# Patient Record
Sex: Female | Born: 1990 | Hispanic: Yes | Marital: Single | Smoking: Never smoker
Health system: Southern US, Community
[De-identification: ages and names within clinical notes are randomized; demographics above are authoritative.]

## PROBLEM LIST (undated history)

## (undated) ENCOUNTER — Inpatient Hospital Stay (HOSPITAL_COMMUNITY): Payer: Self-pay

## (undated) DIAGNOSIS — Z789 Other specified health status: Secondary | ICD-10-CM

## (undated) DIAGNOSIS — E0590 Thyrotoxicosis, unspecified without thyrotoxic crisis or storm: Secondary | ICD-10-CM

## (undated) DIAGNOSIS — R87629 Unspecified abnormal cytological findings in specimens from vagina: Secondary | ICD-10-CM

## (undated) DIAGNOSIS — N390 Urinary tract infection, site not specified: Secondary | ICD-10-CM

## (undated) HISTORY — PX: LEEP: SHX91

## (undated) HISTORY — PX: NO PAST SURGERIES: SHX2092

## (undated) HISTORY — PX: DILATION AND CURETTAGE OF UTERUS: SHX78

## (undated) HISTORY — DX: Unspecified abnormal cytological findings in specimens from vagina: R87.629

## (undated) HISTORY — DX: Thyrotoxicosis, unspecified without thyrotoxic crisis or storm: E05.90

---

## 2012-11-06 ENCOUNTER — Emergency Department (HOSPITAL_COMMUNITY)
Admission: EM | Admit: 2012-11-06 | Discharge: 2012-11-06 | Disposition: A | Discharge status: Home / Self Care | Payer: Self-pay | Attending: Emergency Medicine | Admitting: Emergency Medicine

## 2012-11-06 ENCOUNTER — Encounter (HOSPITAL_COMMUNITY): Payer: Self-pay

## 2012-11-06 ENCOUNTER — Emergency Department (HOSPITAL_COMMUNITY): Payer: Self-pay

## 2012-11-06 ENCOUNTER — Encounter: Payer: Self-pay | Admitting: Gynecology

## 2012-11-06 DIAGNOSIS — N390 Urinary tract infection, site not specified: Secondary | ICD-10-CM | POA: Insufficient documentation

## 2012-11-06 DIAGNOSIS — R1084 Generalized abdominal pain: Secondary | ICD-10-CM | POA: Insufficient documentation

## 2012-11-06 DIAGNOSIS — K5900 Constipation, unspecified: Secondary | ICD-10-CM | POA: Insufficient documentation

## 2012-11-06 DIAGNOSIS — R109 Unspecified abdominal pain: Secondary | ICD-10-CM | POA: Insufficient documentation

## 2012-11-06 DIAGNOSIS — R110 Nausea: Secondary | ICD-10-CM | POA: Insufficient documentation

## 2012-11-06 DIAGNOSIS — Z880 Allergy status to penicillin: Secondary | ICD-10-CM | POA: Insufficient documentation

## 2012-11-06 DIAGNOSIS — Z3202 Encounter for pregnancy test, result negative: Secondary | ICD-10-CM | POA: Insufficient documentation

## 2012-11-06 DIAGNOSIS — R300 Dysuria: Secondary | ICD-10-CM | POA: Insufficient documentation

## 2012-11-06 LAB — CBC WITH DIFFERENTIAL/PLATELET
Basophils Relative: 1 % (ref 0–1)
Eosinophils Absolute: 0.2 10*3/uL (ref 0.0–0.7)
Eosinophils Relative: 4 % (ref 0–5)
HCT: 41.7 % (ref 36.0–46.0)
Hemoglobin: 14.6 g/dL (ref 12.0–15.0)
MCH: 32.7 pg (ref 26.0–34.0)
MCHC: 35 g/dL (ref 30.0–36.0)
Monocytes Absolute: 0.4 10*3/uL (ref 0.1–1.0)
Monocytes Relative: 5 % (ref 3–12)
RDW: 13.7 % (ref 11.5–15.5)

## 2012-11-06 LAB — URINALYSIS, ROUTINE W REFLEX MICROSCOPIC
Glucose, UA: NEGATIVE mg/dL
Protein, ur: NEGATIVE mg/dL
Specific Gravity, Urine: 1.027 (ref 1.005–1.030)
pH: 6 (ref 5.0–8.0)

## 2012-11-06 LAB — COMPREHENSIVE METABOLIC PANEL
Albumin: 4.5 g/dL (ref 3.5–5.2)
BUN: 12 mg/dL (ref 6–23)
Calcium: 9.8 mg/dL (ref 8.4–10.5)
Creatinine, Ser: 0.58 mg/dL (ref 0.50–1.10)
Total Bilirubin: 1 mg/dL (ref 0.3–1.2)
Total Protein: 8.3 g/dL (ref 6.0–8.3)

## 2012-11-06 LAB — URINE MICROSCOPIC-ADD ON

## 2012-11-06 LAB — POCT PREGNANCY, URINE: Preg Test, Ur: NEGATIVE

## 2012-11-06 MED ORDER — KETOROLAC TROMETHAMINE 60 MG/2ML IM SOLN
60.0000 mg | Freq: Once | INTRAMUSCULAR | Status: AC
  Administered 2012-11-06: 60 mg via INTRAMUSCULAR
  Filled 2012-11-06: qty 2

## 2012-11-06 MED ORDER — NITROFURANTOIN MONOHYD MACRO 100 MG PO CAPS: 100.0000 mg | ORAL_CAPSULE | Freq: Two times a day (BID) | ORAL | Status: DC

## 2012-11-06 MED ORDER — ONDANSETRON 4 MG PO TBDP
8.0000 mg | ORAL_TABLET | Freq: Once | ORAL | Status: AC
  Administered 2012-11-06: 8 mg via ORAL
  Filled 2012-11-06: qty 2

## 2012-11-06 MED ORDER — SULFAMETHOXAZOLE-TRIMETHOPRIM 800-160 MG PO TABS: 1.0000 | ORAL_TABLET | Freq: Two times a day (BID) | ORAL | Status: DC

## 2012-11-06 NOTE — ED Provider Notes (Signed)
History     CSN: [id number]  Arrival date & time 11/06/12  1100   First MD Initiated Contact with Patient 11/06/12 1118      Chief Complaint  Patient presents with  . Abdominal Pain     HPI Pt was seen at 1140.   Per pt, c/o gradual onset and persistence of constant generalized abd "pain" for the past 2 weeks.  Has been associated with nausea, bilat flank pain, constipation and dysuria.  Describes the abd pain as "cramping." Describes the dysuria as "pressure" in her bladder when she urinates.  States her friend gave her "some left over antibiotic" that "made everything better" for a short while, but then symptoms returned.  Denies vomiting/diarrhea, no fevers, no back pain, no rash, no CP/SOB, no black or blood in stools, no vaginal bleeding/discharge.       History reviewed. No pertinent past medical history.  History reviewed. No pertinent past surgical history.    History  Substance Use Topics  . Smoking status: Never Smoker   . Smokeless tobacco: Not on file  . Alcohol Use: Yes     Comment: occassional     Review of Systems ROS: Statement: All systems negative except as marked or noted in the HPI; Constitutional: Negative for fever and chills. ; ; Eyes: Negative for eye pain, redness and discharge. ; ; ENMT: Negative for ear pain, hoarseness, nasal congestion, sinus pressure and sore throat. ; ; Cardiovascular: Negative for chest pain, palpitations, diaphoresis, dyspnea and peripheral edema. ; ; Respiratory: Negative for cough, wheezing and stridor. ; ; Gastrointestinal: +nausea, abd pain. Negative for vomiting, diarrhea, blood in stool, hematemesis, jaundice and rectal bleeding. . ; ; Genitourinary: +dysuria, flank pain.  Negative for hematuria. ; ;GYN:  No vaginal bleeding, no vaginal discharge, no vulvar pain.;; Musculoskeletal: Negative for back pain and neck pain. Negative for swelling and trauma.; ; Skin: Negative for pruritus, rash, abrasions, blisters, bruising and  skin lesion.; ; Neuro: Negative for headache, lightheadedness and neck stiffness. Negative for weakness, altered level of consciousness , altered mental status, extremity weakness, paresthesias, involuntary movement, seizure and syncope.       Allergies  Penicillins  Home Medications  No current outpatient prescriptions on file.  BP 114/91  Pulse 94  Temp(Src) 98.3 F (36.8 C) (Oral)  Resp 18  SpO2 99%  Physical Exam 1145: Physical examination:  Nursing notes reviewed; Vital signs and O2 SAT reviewed;  Constitutional: Well developed, Well nourished, Well hydrated, In no acute distress; Head:  Normocephalic, atraumatic; Eyes: EOMI, PERRL, No scleral icterus; ENMT: Mouth and pharynx normal, Mucous membranes moist; Neck: Supple, Full range of motion, No lymphadenopathy; Cardiovascular: Regular rate and rhythm, No murmur, rub, or gallop; Respiratory: Breath sounds clear & equal bilaterally, No rales, rhonchi, wheezes.  Speaking full sentences with ease, Normal respiratory effort/excursion; Chest: Nontender, Movement normal; Abdomen: Soft, +mild diffuse tenderness to palp. No rebound or guarding. Nondistended, Normal bowel sounds; Genitourinary: No CVA tenderness; Spine:  No midline CS, TS, LS tenderness. +TTP bilat lumbar paraspinal muscles.;;  Extremities: Pulses normal, No tenderness, No edema, No calf edema or asymmetry.; Neuro: AA&Ox3, Major CN grossly intact.  Speech clear. No gross focal motor or sensory deficits in extremities.; Skin: Color normal, Warm, Dry.   ED Course  Procedures    MDM  MDM Reviewed: nursing note and vitals Interpretation: labs and x-ray     Results for orders placed during the hospital encounter of 11/06/12  CBC WITH DIFFERENTIAL  Result Value Range   WBC 6.9  4.0 - 10.5 K/uL   RBC 4.47  3.87 - 5.11 MIL/uL   Hemoglobin 14.6  12.0 - 15.0 g/dL   HCT 78.2  95.6 - 21.3 %   MCV 93.3  78.0 - 100.0 fL   MCH 32.7  26.0 - 34.0 pg   MCHC 35.0  30.0 - 36.0  g/dL   RDW 08.6  57.8 - 46.9 %   Platelets 278  150 - 400 K/uL   Neutrophils Relative 62  43 - 77 %   Neutro Abs 4.3  1.7 - 7.7 K/uL   Lymphocytes Relative 29  12 - 46 %   Lymphs Abs 2.0  0.7 - 4.0 K/uL   Monocytes Relative 5  3 - 12 %   Monocytes Absolute 0.4  0.1 - 1.0 K/uL   Eosinophils Relative 4  0 - 5 %   Eosinophils Absolute 0.2  0.0 - 0.7 K/uL   Basophils Relative 1  0 - 1 %   Basophils Absolute 0.0  0.0 - 0.1 K/uL  COMPREHENSIVE METABOLIC PANEL      Result Value Range   Sodium 142  135 - 145 mEq/L   Potassium 4.1  3.5 - 5.1 mEq/L   Chloride 103  96 - 112 mEq/L   CO2 28  19 - 32 mEq/L   Glucose, Bld 93  70 - 99 mg/dL   BUN 12  6 - 23 mg/dL   Creatinine, Ser 6.29  0.50 - 1.10 mg/dL   Calcium 9.8  8.4 - 52.8 mg/dL   Total Protein 8.3  6.0 - 8.3 g/dL   Albumin 4.5  3.5 - 5.2 g/dL   AST 19  0 - 37 U/L   ALT 11  0 - 35 U/L   Alkaline Phosphatase 53  39 - 117 U/L   Total Bilirubin 1.0  0.3 - 1.2 mg/dL   GFR calc non Af Amer >90  >90 mL/min   GFR calc Af Amer >90  >90 mL/min  URINALYSIS, ROUTINE W REFLEX MICROSCOPIC      Result Value Range   Color, Urine YELLOW  YELLOW   APPearance HAZY (*) CLEAR   Specific Gravity, Urine 1.027  1.005 - 1.030   pH 6.0  5.0 - 8.0   Glucose, UA NEGATIVE  NEGATIVE mg/dL   Hgb urine dipstick SMALL (*) NEGATIVE   Bilirubin Urine NEGATIVE  NEGATIVE   Ketones, ur 15 (*) NEGATIVE mg/dL   Protein, ur NEGATIVE  NEGATIVE mg/dL   Urobilinogen, UA 0.2  0.0 - 1.0 mg/dL   Nitrite NEGATIVE  NEGATIVE   Leukocytes, UA SMALL (*) NEGATIVE  URINE MICROSCOPIC-ADD ON      Result Value Range   Squamous Epithelial / LPF RARE  RARE   WBC, UA 7-10  <3 WBC/hpf   RBC / HPF 0-2  <3 RBC/hpf   Bacteria, UA RARE  RARE  POCT PREGNANCY, URINE      Result Value Range   Preg Test, Ur NEGATIVE  NEGATIVE   Dg Abd Acute W/chest 11/06/2012  *RADIOLOGY REPORT*  Clinical Data: Abdominal pain, constipation  ACUTE ABDOMEN SERIES (ABDOMEN 2 VIEW & CHEST 1 VIEW)   Comparison: None.  Findings: The lungs are clear and well aerated.  Cardiac and mediastinal contours within normal limits.  No pleural effusion or pneumothorax.  No free air on the upright view.  The bowel gas pattern is not obstructed and unremarkable.  Gas is seen throughout the colon  to the rectum.  An IUD projects over the anatomic pelvis.  The colonic stool burden is unremarkable.  No acute osseous abnormality.  IMPRESSION:  1.  No acute cardiopulmonary disease. 2.  Nonobstructed, unremarkable bowel gas pattern. 3.  Colonic stool burden is unremarkable. 4.  IUD projects over the anatomic pelvis.   Original Report Authenticated By: Malachy Moan, M.D.     1430:  Pt eating fast food meal/drink while in the ED without N/V. VSS, abd soft/NT, appears NAD. States she wants to go home now.  Will tx for cystitis. Dx and testing d/w pt and family.  Questions answered.  Verb understanding, agreeable to d/c home with outpt f/u.  1510:  Pt states she "can't afford" the macrobid.  Is allergic to PCN so cannot rx keflex.  Requesting "something off the $4 [facility] list."  D/W pt regarding local UTI bacterial resistance patterns.  Verb understanding, but states she "needs bactrim because it's on the $4 list."  Will rx.       Laray Anger, DO 11/09/12 [phone]

## 2012-11-06 NOTE — ED Notes (Signed)
Symptoms began 2 weeks ago. Bilateral flank pain, and abdominal pain.  Pt. Reports having dysuria and hematuria.  Also having vaginal spotting. Pt. Is nauseated.  Also reports bruising over her extremities and weakness

## 2012-11-08 ENCOUNTER — Ambulatory Visit: Payer: Self-pay | Admitting: Women's Health

## 2012-12-22 DIAGNOSIS — F329 Major depressive disorder, single episode, unspecified: Secondary | ICD-10-CM | POA: Insufficient documentation

## 2016-09-23 ENCOUNTER — Ambulatory Visit: Payer: Self-pay | Admitting: Gynecology

## 2016-09-23 DIAGNOSIS — Z0289 Encounter for other administrative examinations: Secondary | ICD-10-CM

## 2016-10-11 ENCOUNTER — Ambulatory Visit: Payer: Self-pay | Admitting: Gynecology

## 2016-10-11 DIAGNOSIS — Z0289 Encounter for other administrative examinations: Secondary | ICD-10-CM

## 2016-11-30 ENCOUNTER — Encounter: Payer: Self-pay | Admitting: Gynecology

## 2017-09-12 ENCOUNTER — Encounter (HOSPITAL_COMMUNITY): Payer: Self-pay | Admitting: *Deleted

## 2017-09-12 ENCOUNTER — Inpatient Hospital Stay (HOSPITAL_COMMUNITY)
Admission: AD | Admit: 2017-09-12 | Discharge: 2017-09-12 | Disposition: A | Discharge status: Home / Self Care | Payer: Medicaid Other | Source: Ambulatory Visit | Attending: Family Medicine | Admitting: Family Medicine

## 2017-09-12 ENCOUNTER — Other Ambulatory Visit: Payer: Self-pay

## 2017-09-12 ENCOUNTER — Inpatient Hospital Stay (HOSPITAL_COMMUNITY): Payer: Medicaid Other

## 2017-09-12 DIAGNOSIS — O26891 Other specified pregnancy related conditions, first trimester: Secondary | ICD-10-CM | POA: Insufficient documentation

## 2017-09-12 DIAGNOSIS — O26899 Other specified pregnancy related conditions, unspecified trimester: Secondary | ICD-10-CM

## 2017-09-12 DIAGNOSIS — R109 Unspecified abdominal pain: Secondary | ICD-10-CM | POA: Diagnosis present

## 2017-09-12 DIAGNOSIS — O23592 Infection of other part of genital tract in pregnancy, second trimester: Secondary | ICD-10-CM | POA: Diagnosis not present

## 2017-09-12 DIAGNOSIS — B9689 Other specified bacterial agents as the cause of diseases classified elsewhere: Secondary | ICD-10-CM | POA: Insufficient documentation

## 2017-09-12 DIAGNOSIS — N760 Acute vaginitis: Secondary | ICD-10-CM

## 2017-09-12 DIAGNOSIS — Z3A01 Less than 8 weeks gestation of pregnancy: Secondary | ICD-10-CM | POA: Insufficient documentation

## 2017-09-12 DIAGNOSIS — Z3491 Encounter for supervision of normal pregnancy, unspecified, first trimester: Secondary | ICD-10-CM

## 2017-09-12 LAB — WET PREP, GENITAL
SPERM: NONE SEEN
Trich, Wet Prep: NONE SEEN
YEAST WET PREP: NONE SEEN

## 2017-09-12 LAB — URINALYSIS, ROUTINE W REFLEX MICROSCOPIC
Bilirubin Urine: NEGATIVE
Glucose, UA: NEGATIVE mg/dL
Hgb urine dipstick: NEGATIVE
Ketones, ur: NEGATIVE mg/dL
LEUKOCYTES UA: NEGATIVE
NITRITE: NEGATIVE
PH: 6 (ref 5.0–8.0)
Protein, ur: NEGATIVE mg/dL
SPECIFIC GRAVITY, URINE: 1.008 (ref 1.005–1.030)

## 2017-09-12 LAB — POCT PREGNANCY, URINE: Preg Test, Ur: POSITIVE — AB

## 2017-09-12 LAB — HCG, QUANTITATIVE, PREGNANCY: hCG, Beta Chain, Quant, S: 33138 m[IU]/mL — ABNORMAL HIGH (ref <5)

## 2017-09-12 LAB — CBC
HEMATOCRIT: 37.9 % (ref 36.0–46.0)
Hemoglobin: 12.7 g/dL (ref 12.0–15.0)
MCH: 32.6 pg (ref 26.0–34.0)
MCHC: 33.5 g/dL (ref 30.0–36.0)
MCV: 97.2 fL (ref 78.0–100.0)
PLATELETS: 338 10*3/uL (ref 150–400)
RBC: 3.9 MIL/uL (ref 3.87–5.11)
RDW: 12.9 % (ref 11.5–15.5)
WBC: 7.5 10*3/uL (ref 4.0–10.5)

## 2017-09-12 LAB — ABO/RH: ABO/RH(D): A POS

## 2017-09-12 MED ORDER — METRONIDAZOLE 500 MG PO TABS: 500.0000 mg | ORAL_TABLET | Freq: Two times a day (BID) | ORAL | 0 refills | Status: DC

## 2017-09-12 NOTE — MAU Provider Note (Signed)
History     CSN: [id number]  Arrival date and time: 09/12/17 1141   First Provider Initiated Contact with Patient 09/12/17 1252      Chief Complaint  Patient presents with  . Abdominal Pain  . Vaginal Bleeding  . Possible Pregnancy   HPI Martha Johnson is a 27 y.o. G2P1001 at 769w5d by LMP who presents with abdominal pain and vaginal bleeding. Had positive HPT but hasn't been seen by ob/gyn yet. Reports LLQ pain since last night. Pain is intermittent. Rates 7/10. Has not treated pain. Nothing makes better or worse. Has noticed pink spotting on toilet paper since yesterday. Is not bleeding into a pad or passing clots. Last intercourse 3 days ago. Denies fever/chills, dysuria, vaginal discharge, or diarrhea.   OB History    Gravida Para Term Preterm AB Living   2 1 1     1    SAB TAB Ectopic Multiple Live Births           1      History reviewed. No pertinent past medical history.  Past Surgical History:  Procedure Laterality Date  . LEEP      No family history on file.  Social History   Tobacco Use  . Smoking status: Never Smoker  Substance Use Topics  . Alcohol use: Yes    Comment: occassional  . Drug use: Yes    Types: Marijuana    Allergies:  Allergies  Allergen Reactions  . Penicillins Other (See Comments)    Childhood reaction    Medications Prior to Admission  Medication Sig Dispense Refill Last Dose  . sulfamethoxazole-trimethoprim (BACTRIM DS) 800-160 MG per tablet Take 1 tablet by mouth 2 (two) times daily. 14 tablet 0     Review of Systems  Constitutional: Negative.   Gastrointestinal: Positive for abdominal pain. Negative for diarrhea, nausea and vomiting.  Genitourinary: Positive for vaginal bleeding. Negative for dysuria and vaginal discharge.   Physical Exam   Blood pressure 118/62, pulse 88, temperature 98.3 F (36.8 C), temperature source Oral, resp. rate 17, height 5' 9.5" (1.765 m), weight 188 lb (85.3 kg), last menstrual period  07/06/2017, SpO2 100 %.  Physical Exam  Nursing note and vitals reviewed. Constitutional: She is oriented to person, place, and time. She appears well-developed and well-nourished. No distress.  HENT:  Head: Normocephalic and atraumatic.  Eyes: Conjunctivae are normal. Right eye exhibits no discharge. Left eye exhibits no discharge. No scleral icterus.  Neck: Normal range of motion.  Respiratory: Effort normal. No respiratory distress.  GI: Soft. She exhibits no distension. There is no tenderness.  Genitourinary: Cervix exhibits no motion tenderness and no friability. No bleeding in the vagina. Vaginal discharge (small amount of gray thin discharge) found.  Genitourinary Comments: Cervix closed  Neurological: She is alert and oriented to person, place, and time.  Skin: Skin is warm and dry. She is not diaphoretic.  Psychiatric: She has a normal mood and affect. Her behavior is normal. Judgment and thought content normal.    MAU Course  Procedures Results for orders placed or performed during the hospital encounter of 09/12/17 (from the past 24 hour(s))  Urinalysis, Routine w reflex microscopic ([facility])     Status: Abnormal   Collection Time: 09/12/17 12:00 PM  Result Value Ref Range   Color, Urine STRAW (A) YELLOW   APPearance CLEAR CLEAR   Specific Gravity, Urine 1.008 1.005 - 1.030   pH 6.0 5.0 - 8.0   Glucose, UA NEGATIVE NEGATIVE  mg/dL   Hgb urine dipstick NEGATIVE NEGATIVE   Bilirubin Urine NEGATIVE NEGATIVE   Ketones, ur NEGATIVE NEGATIVE mg/dL   Protein, ur NEGATIVE NEGATIVE mg/dL   Nitrite NEGATIVE NEGATIVE   Leukocytes, UA NEGATIVE NEGATIVE  Pregnancy, urine POC     Status: Abnormal   Collection Time: 09/12/17 12:26 PM  Result Value Ref Range   Preg Test, Ur POSITIVE (A) NEGATIVE  CBC     Status: None   Collection Time: 09/12/17 12:56 PM  Result Value Ref Range   WBC 7.5 4.0 - 10.5 K/uL   RBC 3.90 3.87 - 5.11 MIL/uL   Hemoglobin 12.7 12.0 - 15.0 g/dL   HCT  16.1 09.6 - 04.5 %   MCV 97.2 78.0 - 100.0 fL   MCH 32.6 26.0 - 34.0 pg   MCHC 33.5 30.0 - 36.0 g/dL   RDW 40.9 81.1 - 91.4 %   Platelets 338 150 - 400 K/uL  hCG, quantitative, pregnancy     Status: Abnormal   Collection Time: 09/12/17 12:56 PM  Result Value Ref Range   hCG, Beta Chain, Quant, S 33,138 (H) <5 mIU/mL  ABO/Rh     Status: None   Collection Time: 09/12/17 12:57 PM  Result Value Ref Range   ABO/RH(D)      A POS Performed at [facility], [address], [place], [place] [address]   Wet prep, genital     Status: Abnormal   Collection Time: 09/12/17  3:05 PM  Result Value Ref Range   Yeast Wet Prep HPF POC NONE SEEN NONE SEEN   Trich, Wet Prep NONE SEEN NONE SEEN   Clue Cells Wet Prep HPF POC PRESENT (A) NONE SEEN   WBC, Wet Prep HPF POC FEW (A) NONE SEEN   Sperm NONE SEEN    US Ob Less Than 14 Weeks With Ob Transvaginal  Result Date: 09/12/2017 CLINICAL DATA:  Abdominal pain, first trimester pregnancy, LEFT side pain for and spotting for 1 day, quantitative beta HCG = 33,138 EXAM: OBSTETRIC <14 WK [place] AND TRANSVAGINAL OB US TECHNIQUE: Both transabdominal and transvaginal ultrasound examinations were performed for complete evaluation of the gestation as well as the maternal uterus, adnexal regions, and pelvic cul-de-sac. Transvaginal technique was performed to assess early pregnancy. COMPARISON:  None FINDINGS: Intrauterine gestational sac: Present, single Yolk sac:  Present Embryo:  Present Cardiac Activity: Not visualize Heart Rate: N/A  bpm CRL:  4.9 mm   6 w   1 d                  [place] EDC: 05/07/2018 Subchorionic hemorrhage:  None visualized. Maternal uterus/adnexae: RIGHT ovary normal size and morphology, 2.7 x 2.0 x 1.6 cm. LEFT ovary measures 2.7 x 3.6 x 2.4 cm and contains a small corpus luteal cyst. No adnexal masses or free pelvic fluid. IMPRESSION: Single intrauterine gestation with crown-rump length corresponding to 6 weeks 1 day EGA. However no fetal cardiac  activity is identified. Findings are suspicious but not yet definitive for failed pregnancy. Recommend follow-up US in 10-14 days for definitive diagnosis. This recommendation follows SRU consensus guidelines: Diagnostic Criteria for Nonviable Pregnancy Early in the First Trimester. Malva Limes Med 2013; 621:3086-57. Electronically Signed   By: Ulyses Southward M.D.   On: 09/12/2017 14:47    MDM +UPT UA, wet prep, GC/chlamydia, CBC, ABO/Rh, quant hCG, HIV, and [place] today to rule out ectopic pregnancy A positive On exam, no bleeding noted and cervix closed/thick Ultrasound shows SIUP  measuring [redacted]w[redacted]d (EDD updated) --- no cardiac activity, CRL 4.47mm. CLC in left ovary.  Discussed results with patient. FHR may be too early to be seen or could be failed pregnancy. Will schedule outpatient ultrasound for viability.   Assessment and Plan  A; 1. BV (bacterial vaginosis)   2. Abdominal pain affecting pregnancy   3. Normal IUP (intrauterine pregnancy) on prenatal ultrasound, first trimester    P: Discharge home Rx flagyl Outpatient ultrasound in 10 days for viability Miscarriage precautions  Judeth Horn 09/12/2017, 12:52 PM

## 2017-09-12 NOTE — Discharge Instructions (Signed)
Bacterial Vaginosis Bacterial vaginosis is a vaginal infection that occurs when the normal balance of bacteria in the vagina is disrupted. It results from an overgrowth of certain bacteria. This is the most common vaginal infection among women ages 7-44. Because bacterial vaginosis increases your risk for STIs (sexually transmitted infections), getting treated can help reduce your risk for chlamydia, gonorrhea, herpes, and HIV (human immunodeficiency virus). Treatment is also important for preventing complications in pregnant women, because this condition can cause an early (premature) delivery. What are the causes? This condition is caused by an increase in harmful bacteria that are normally present in small amounts in the vagina. However, the reason that the condition develops is not fully understood. What increases the risk? The following factors may make you more likely to develop this condition:  Having a new sexual partner or multiple sexual partners.  Having unprotected sex.  Douching.  Having an intrauterine device (IUD).  Smoking.  Drug and alcohol abuse.  Taking certain antibiotic medicines.  Being pregnant.  You cannot get bacterial vaginosis from toilet seats, bedding, swimming pools, or contact with objects around you. What are the signs or symptoms? Symptoms of this condition include:  Grey or white vaginal discharge. The discharge can also be watery or foamy.  A fish-like odor with discharge, especially after sexual intercourse or during menstruation.  Itching in and around the vagina.  Burning or pain with urination.  Some women with bacterial vaginosis have no signs or symptoms. How is this diagnosed? This condition is diagnosed based on:  Your medical history.  A physical exam of the vagina.  Testing a sample of vaginal fluid under a microscope to look for a large amount of bad bacteria or abnormal cells. Your health care provider may use a cotton swab  or a small wooden spatula to collect the sample.  How is this treated? This condition is treated with antibiotics. These may be given as a pill, a vaginal cream, or a medicine that is put into the vagina (suppository). If the condition comes back after treatment, a second round of antibiotics may be needed. Follow these instructions at home: Medicines  Take over-the-counter and prescription medicines only as told by your health care provider.  Take or use your antibiotic as told by your health care provider. Do not stop taking or using the antibiotic even if you start to feel better. General instructions  If you have a female sexual partner, tell her that you have a vaginal infection. She should see her health care provider and be treated if she has symptoms. If you have a female sexual partner, he does not need treatment.  During treatment: ? Avoid sexual activity until you finish treatment. ? Do not douche. ? Avoid alcohol as directed by your health care provider. ? Avoid breastfeeding as directed by your health care provider.  Drink enough water and fluids to keep your urine clear or pale yellow.  Keep the area around your vagina and rectum clean. ? Wash the area daily with warm water. ? Wipe yourself from front to back after using the toilet.  Keep all follow-up visits as told by your health care provider. This is important. How is this prevented?  Do not douche.  Wash the outside of your vagina with warm water only.  Use protection when having sex. This includes latex condoms and dental dams.  Limit how many sexual partners you have. To help prevent bacterial vaginosis, it is best to have sex with just  one partner (monogamous).  Make sure you and your sexual partner are tested for STIs.  Wear cotton or cotton-lined underwear.  Avoid wearing tight pants and pantyhose, especially during summer.  Limit the amount of alcohol that you drink.  Do not use any products that  contain nicotine or tobacco, such as cigarettes and e-cigarettes. If you need help quitting, ask your health care provider.  Do not use illegal drugs. Where to find more information:  Centers for Disease Control and Prevention: SolutionApps.co.zawww.cdc.gov/std  American Sexual Health Association (ASHA): www.ashastd.org  U.S. Department of Health and Health and safety inspectorHuman Services, Office on Women's Health: ConventionalMedicines.siwww.womenshealth.gov/ or http://www.anderson-williamson.info/https://www.womenshealth.gov/a-z-topics/bacterial-vaginosis Contact a health care provider if:  Your symptoms do not improve, even after treatment.  You have more discharge or pain when urinating.  You have a fever.  You have pain in your abdomen.  You have pain during sex.  You have vaginal bleeding between periods. Summary  Bacterial vaginosis is a vaginal infection that occurs when the normal balance of bacteria in the vagina is disrupted.  Because bacterial vaginosis increases your risk for STIs (sexually transmitted infections), getting treated can help reduce your risk for chlamydia, gonorrhea, herpes, and HIV (human immunodeficiency virus). Treatment is also important for preventing complications in pregnant women, because the condition can cause an early (premature) delivery.  This condition is treated with antibiotic medicines. These may be given as a pill, a vaginal cream, or a medicine that is put into the vagina (suppository). This information is not intended to replace advice given to you by your health care provider. Make sure you discuss any questions you have with your health care provider. Document Released: 07/04/2005 Document Revised: 11/07/2016 Document Reviewed: 03/19/2016 Elsevier Interactive Patient Education  2018 [employer]. Abdominal Pain During Pregnancy Abdominal pain is common in pregnancy. Most of the time, it does not cause harm. There are many causes of abdominal pain. Some causes are more serious than others and sometimes the cause is not known.  Abdominal pain can be a sign that something is very wrong with the pregnancy or the pain may have nothing to do with the pregnancy. Always tell your health care provider if you have any abdominal pain. Follow these instructions at home:  Do not have sex or put anything in your vagina until your symptoms go away completely.  Watch your abdominal pain for any changes.  Get plenty of rest until your pain improves.  Drink enough fluid to keep your urine clear or pale yellow.  Take over-the-counter or prescription medicines only as told by your health care provider.  Keep all follow-up visits as told by your health care provider. This is important. Contact a health care provider if:  You have a fever.  Your pain gets worse or you have cramping.  Your pain continues after resting. Get help right away if:  You are bleeding, leaking fluid, or passing tissue from the vagina.  You have vomiting or diarrhea that does not go away.  You have painful or bloody urination.  You notice a decrease in your baby's movements.  You feel very weak or faint.  You have shortness of breath.  You develop a severe headache with abdominal pain.  You have abnormal vaginal discharge with abdominal pain. This information is not intended to replace advice given to you by your health care provider. Make sure you discuss any questions you have with your health care provider. Document Released: 07/04/2005 Document Revised: 04/14/2016 Document Reviewed: 01/31/2013 Elsevier Interactive Patient Education  2018 [employer].

## 2017-09-12 NOTE — MAU Note (Signed)
Having a lot of pain on the left side, started last night, got worse today. Had some spotting this morning.  Has not been seen with the preg.  Feels sick, like something is not right in her body

## 2017-09-13 LAB — GC/CHLAMYDIA PROBE AMP (~~LOC~~) NOT AT ARMC
Chlamydia: NEGATIVE
NEISSERIA GONORRHEA: NEGATIVE

## 2017-10-01 ENCOUNTER — Inpatient Hospital Stay (HOSPITAL_COMMUNITY)
Admission: AD | Admit: 2017-10-01 | Discharge: 2017-10-01 | Disposition: A | Discharge status: Home / Self Care | Payer: Self-pay | Source: Ambulatory Visit | Attending: Obstetrics & Gynecology | Admitting: Obstetrics & Gynecology

## 2017-10-01 ENCOUNTER — Encounter (HOSPITAL_COMMUNITY): Payer: Self-pay | Admitting: *Deleted

## 2017-10-01 ENCOUNTER — Inpatient Hospital Stay (HOSPITAL_COMMUNITY): Payer: Self-pay

## 2017-10-01 DIAGNOSIS — O021 Missed abortion: Secondary | ICD-10-CM | POA: Insufficient documentation

## 2017-10-01 DIAGNOSIS — Z880 Allergy status to penicillin: Secondary | ICD-10-CM | POA: Insufficient documentation

## 2017-10-01 DIAGNOSIS — Z3A01 Less than 8 weeks gestation of pregnancy: Secondary | ICD-10-CM | POA: Insufficient documentation

## 2017-10-01 DIAGNOSIS — O26851 Spotting complicating pregnancy, first trimester: Secondary | ICD-10-CM | POA: Insufficient documentation

## 2017-10-01 LAB — URINALYSIS, ROUTINE W REFLEX MICROSCOPIC
Bilirubin Urine: NEGATIVE
GLUCOSE, UA: NEGATIVE mg/dL
KETONES UR: NEGATIVE mg/dL
LEUKOCYTES UA: NEGATIVE
Nitrite: NEGATIVE
PH: 7 (ref 5.0–8.0)
Protein, ur: NEGATIVE mg/dL
Specific Gravity, Urine: 1.013 (ref 1.005–1.030)

## 2017-10-01 MED ORDER — IBUPROFEN 800 MG PO TABS: 800.0000 mg | ORAL_TABLET | Freq: Three times a day (TID) | ORAL | 1 refills | Status: DC

## 2017-10-01 NOTE — MAU Note (Signed)
Urine in lab 

## 2017-10-01 NOTE — MAU Provider Note (Signed)
History     CSN: [id number]  Arrival date and time: 10/01/17 1513   First Provider Initiated Contact with Patient 10/01/17 1539      Chief Complaint  Patient presents with  . Vaginal Bleeding   HPI Martha Johnson is a 27 y.o. G2P1001 at 4081w6d who presents with spotting when she wipes. She states for the last 2 weeks she has had brown discharge but today it became bright red spotting when she wiped. She denies any pain. Denies any other abnormal vaginal discharge. Denies any constipation, diarrhea, nausea or vomiting.   OB History    Gravida Para Term Preterm AB Living   2 1 1     1    SAB TAB Ectopic Multiple Live Births           1      History reviewed. No pertinent past medical history.  Past Surgical History:  Procedure Laterality Date  . LEEP      History reviewed. No pertinent family history.  Social History   Tobacco Use  . Smoking status: Never Smoker  . Smokeless tobacco: Never Used  Substance Use Topics  . Alcohol use: No    Frequency: Never    Comment: occassional  . Drug use: No    Allergies:  Allergies  Allergen Reactions  . Penicillins Other (See Comments)    Childhood reaction    Medications Prior to Admission  Medication Sig Dispense Refill Last Dose  . metroNIDAZOLE (FLAGYL) 500 MG tablet Take 1 tablet (500 mg total) by mouth 2 (two) times daily. 14 tablet 0     Review of Systems  Constitutional: Negative.  Negative for fatigue and fever.  HENT: Negative.   Respiratory: Negative.  Negative for shortness of breath.   Cardiovascular: Negative.  Negative for chest pain.  Gastrointestinal: Negative.  Negative for abdominal pain, constipation, diarrhea, nausea and vomiting.  Genitourinary: Positive for vaginal bleeding. Negative for dysuria.  Neurological: Negative.  Negative for dizziness and headaches.   Physical Exam   Blood pressure 124/66, pulse 98, temperature 97.7 F (36.5 C), temperature source Oral, resp. rate 17, last  menstrual period 07/06/2017.  Physical Exam  Nursing note and vitals reviewed. Constitutional: She is oriented to person, place, and time. She appears well-developed and well-nourished. No distress.  HENT:  Head: Normocephalic.  Eyes: Pupils are equal, round, and reactive to light.  Cardiovascular: Normal rate, regular rhythm and normal heart sounds.  Respiratory: Effort normal and breath sounds normal. No respiratory distress.  GI: Soft. Bowel sounds are normal. She exhibits no distension. There is no tenderness.  Neurological: She is alert and oriented to person, place, and time.  Skin: Skin is warm and dry.  Psychiatric: She has a normal mood and affect. Her behavior is normal. Judgment and thought content normal.    MAU Course  Procedures Results for orders placed or performed during the hospital encounter of 10/01/17 (from the past 24 hour(s))  Urinalysis, Routine w reflex microscopic     Status: Abnormal   Collection Time: 10/01/17  3:22 PM  Result Value Ref Range   Color, Urine YELLOW YELLOW   APPearance HAZY (A) CLEAR   Specific Gravity, Urine 1.013 1.005 - 1.030   pH 7.0 5.0 - 8.0   Glucose, UA NEGATIVE NEGATIVE mg/dL   Hgb urine dipstick MODERATE (A) NEGATIVE   Bilirubin Urine NEGATIVE NEGATIVE   Ketones, ur NEGATIVE NEGATIVE mg/dL   Protein, ur NEGATIVE NEGATIVE mg/dL   Nitrite NEGATIVE NEGATIVE  Leukocytes, UA NEGATIVE NEGATIVE   RBC / HPF 0-5 0 - 5 RBC/hpf   WBC, UA 0-5 0 - 5 WBC/hpf   Bacteria, UA RARE (A) NONE SEEN   Squamous Epithelial / LPF 0-5 (A) NONE SEEN   Mucus PRESENT    US Ob Transvaginal  Result Date: 10/01/2017 CLINICAL DATA:  27 y/o  F; bleeding and pain, pregnant patient. EXAM: TRANSVAGINAL OB ULTRASOUND TECHNIQUE: Transvaginal ultrasound was performed for complete evaluation of the gestation as well as the maternal uterus, adnexal regions, and pelvic cul-de-sac. COMPARISON:  09/12/2017 fetal ultrasound. FINDINGS: Intrauterine gestational sac:  Single Yolk sac:  Visualized. Embryo:  Visualized. Yolk sac and embryo are difficult to distinguish on the current study Cardiac Activity: Not Visualized. Heart Rate: Absent CRL:   3.5 mm mm   6 w 0 d                  [place] EDC: 05/27/2018 Subchorionic hemorrhage:  None visualized. Maternal uterus/adnexae: Normal. IMPRESSION: Absence of embryo with a heart beat greater than 2 weeks after study demonstrating embryo without appreciable heartbeat. Interval decreased CRL. Findings compatible with definitive criteria for failed pregnancy. SRU consensus guidelines: Diagnostic Criteria for Nonviable Pregnancy Early in the First Trimester. Macy Mis J Med [phone]. Electronically Signed   By: Mitzi Hansen M.D.   On: 10/01/2017 17:17    MDM UA US OB Transvaginal Reviewed with patient results of ultrasound indicating missed AB. Offered patient cytotec vs expectant management. Patient desires expectant management.   Assessment and Plan   1. Missed abortion   2. Less than [redacted] weeks gestation of pregnancy    -Discharge home in stable condition -Rx for ibuprofen sent to patient's pharmacy -Vaginal bleeding and pain precautions discussed -Patient advised to follow-up with [facility] in 1 week for repeat blood work -Patient may return to MAU as needed or if her condition were to change or worsen  Rolm Bookbinder CNM 10/01/2017, 3:44 PM

## 2017-10-01 NOTE — Discharge Instructions (Signed)

## 2017-10-01 NOTE — Progress Notes (Signed)
Pt was given verbal discharge instructions and spoke with the provider about when to return.  She left before being formally discharged with paperwork.

## 2017-10-01 NOTE — MAU Note (Signed)
Pt states she started having brown spotting four days ago but today it is bright red but not heavier.  It goes on and off.  Denies abdominal pain.  Pt had intercourse four days ago.

## 2017-10-02 ENCOUNTER — Telehealth: Payer: Self-pay | Admitting: General Practice

## 2017-10-02 NOTE — Telephone Encounter (Signed)
Left message on VM for patient to give our office a call back in regards to Lab and provider appointments.  Appointment Reminders will be mailed to patient.

## 2017-10-04 ENCOUNTER — Encounter (HOSPITAL_COMMUNITY): Payer: Self-pay | Admitting: *Deleted

## 2017-10-04 ENCOUNTER — Inpatient Hospital Stay (HOSPITAL_COMMUNITY): Payer: Self-pay

## 2017-10-04 ENCOUNTER — Inpatient Hospital Stay (HOSPITAL_COMMUNITY)
Admission: AD | Admit: 2017-10-04 | Discharge: 2017-10-04 | Disposition: A | Discharge status: Home / Self Care | Payer: Self-pay | Source: Ambulatory Visit | Attending: Family Medicine | Admitting: Family Medicine

## 2017-10-04 DIAGNOSIS — O039 Complete or unspecified spontaneous abortion without complication: Secondary | ICD-10-CM

## 2017-10-04 DIAGNOSIS — Z3A09 9 weeks gestation of pregnancy: Secondary | ICD-10-CM | POA: Insufficient documentation

## 2017-10-04 DIAGNOSIS — Z9889 Other specified postprocedural states: Secondary | ICD-10-CM | POA: Insufficient documentation

## 2017-10-04 DIAGNOSIS — Z880 Allergy status to penicillin: Secondary | ICD-10-CM | POA: Insufficient documentation

## 2017-10-04 DIAGNOSIS — O3412 Maternal care for benign tumor of corpus uteri, second trimester: Secondary | ICD-10-CM | POA: Insufficient documentation

## 2017-10-04 DIAGNOSIS — O034 Incomplete spontaneous abortion without complication: Secondary | ICD-10-CM | POA: Insufficient documentation

## 2017-10-04 DIAGNOSIS — N8312 Corpus luteum cyst of left ovary: Secondary | ICD-10-CM | POA: Insufficient documentation

## 2017-10-04 HISTORY — DX: Other specified health status: Z78.9

## 2017-10-04 MED ORDER — MISOPROSTOL 200 MCG PO TABS: ORAL_TABLET | ORAL | 1 refills | Status: DC

## 2017-10-04 MED ORDER — ONDANSETRON 4 MG PO TBDP: 4.0000 mg | ORAL_TABLET | Freq: Four times a day (QID) | ORAL | 0 refills | Status: DC | PRN

## 2017-10-04 MED ORDER — OXYCODONE-ACETAMINOPHEN 5-325 MG PO TABS: 1.0000 | ORAL_TABLET | Freq: Four times a day (QID) | ORAL | 0 refills | Status: DC | PRN

## 2017-10-04 MED ORDER — HYDROMORPHONE HCL 1 MG/ML IJ SOLN
1.0000 mg | Freq: Once | INTRAMUSCULAR | Status: AC
  Administered 2017-10-04: 1 mg via INTRAMUSCULAR
  Filled 2017-10-04: qty 1

## 2017-10-04 MED ORDER — PROMETHAZINE HCL 25 MG/ML IJ SOLN
25.0000 mg | Freq: Once | INTRAMUSCULAR | Status: AC
Start: 2017-10-04 — End: 2017-10-04
  Administered 2017-10-04: 25 mg via INTRAMUSCULAR
  Filled 2017-10-04: qty 1

## 2017-10-04 MED ORDER — ONDANSETRON 8 MG PO TBDP
8.0000 mg | ORAL_TABLET | Freq: Once | ORAL | Status: AC
  Administered 2017-10-04: 8 mg via ORAL
  Filled 2017-10-04: qty 1

## 2017-10-04 MED ORDER — HYDROMORPHONE HCL 1 MG/ML IJ SOLN
1.0000 mg | Freq: Once | INTRAMUSCULAR | Status: AC
Start: 2017-10-04 — End: 2017-10-04
  Administered 2017-10-04: 1 mg via INTRAMUSCULAR
  Filled 2017-10-04: qty 1

## 2017-10-04 MED ORDER — MISOPROSTOL 200 MCG PO TABS
800.0000 ug | ORAL_TABLET | Freq: Once | ORAL | Status: AC
  Administered 2017-10-04: 800 ug via BUCCAL
  Filled 2017-10-04: qty 4

## 2017-10-04 NOTE — Progress Notes (Signed)
Pt passing some small clots, possible POC's in vagina

## 2017-10-04 NOTE — MAU Provider Note (Addendum)
Chief Complaint: No chief complaint on file.   First Provider Initiated Contact with Patient 10/04/17 0247        SUBJECTIVE HPI: Martha Johnson is a 27 y.o. G2P1001 at 2334w2d by LMP who presents to maternity admissions reporting severe pelvic cramping and bleeding. . She denies vaginal itching/burning, urinary symptoms, h/a, dizziness, n/v, or fever/chills.    Was seen last on 10/01/17 and diagnosed with missed abortion of 6 wk size pregnancy.  Was sent home for expectant management with HCG followup  Started having cramping and bleeding at 3pm  Abdominal Pain  This is a new problem. The current episode started today. The problem occurs intermittently. The problem has been gradually worsening. The pain is located in the LLQ, RLQ and suprapubic region. The pain is severe. The quality of the pain is cramping and sharp. The abdominal pain does not radiate. Associated symptoms include nausea and vomiting. Pertinent negatives include no constipation, diarrhea, dysuria, fever, frequency or headaches. Nothing aggravates the pain. The pain is relieved by nothing. She has tried nothing for the symptoms.  Vaginal Bleeding  The patient's primary symptoms include pelvic pain and vaginal bleeding. The patient's pertinent negatives include no genital itching, genital lesions or genital odor. This is a new problem. The current episode started today. The problem occurs intermittently. The pain is severe. The problem affects both sides. She is pregnant. Associated symptoms include abdominal pain, nausea and vomiting. Pertinent negatives include no constipation, diarrhea, dysuria, fever, frequency or headaches. The vaginal discharge was bloody. The vaginal bleeding is lighter than menses. She has not been passing clots. She has not been passing tissue. Nothing aggravates the symptoms. She has tried nothing for the symptoms.    RN Note: PT SAYS PAIN AND BLEEDING STARTED AT 3 PM .    TOOK IBUPROFEN  1 TAB AT 3 AND 7  PM   ON ARRIVAL- SMALL AMT  BLEEDING     No past medical history on file. Past Surgical History:  Procedure Laterality Date  . LEEP     Social History   Socioeconomic History  . Marital status: Single    Spouse name: Not on file  . Number of children: Not on file  . Years of education: Not on file  . Highest education level: Not on file  Social Needs  . Financial resource strain: Not on file  . Food insecurity - worry: Not on file  . Food insecurity - inability: Not on file  . Transportation needs - medical: Not on file  . Transportation needs - non-medical: Not on file  Occupational History  . Not on file  Tobacco Use  . Smoking status: Never Smoker  . Smokeless tobacco: Never Used  Substance and Sexual Activity  . Alcohol use: No    Frequency: Never    Comment: occassional  . Drug use: No  . Sexual activity: Yes  Other Topics Concern  . Not on file  Social History Narrative  . Not on file   No current facility-administered medications on file prior to encounter.    Current Outpatient Medications on File Prior to Encounter  Medication Sig Dispense Refill  . ibuprofen (ADVIL,MOTRIN) 800 MG tablet Take 1 tablet (800 mg total) by mouth 3 (three) times daily. 30 tablet 1  . metroNIDAZOLE (FLAGYL) 500 MG tablet Take 1 tablet (500 mg total) by mouth 2 (two) times daily. 14 tablet 0   Allergies  Allergen Reactions  . Penicillins Other (See Comments)    Childhood  reaction    I have reviewed patient's Past Medical Hx, Surgical Hx, Family Hx, Social Hx, medications and allergies.   ROS:  Review of Systems  Constitutional: Negative for fever.  Gastrointestinal: Positive for abdominal pain, nausea and vomiting. Negative for constipation and diarrhea.  Genitourinary: Positive for pelvic pain and vaginal bleeding. Negative for dysuria and frequency.  Neurological: Negative for headaches.   Review of Systems  Other systems negative   Physical Exam  Physical  Exam Patient Vitals for the past 24 hrs:  BP Temp src Pulse Resp SpO2  10/04/17 0232 127/62 Oral 99 (!) 30 100 %   Constitutional: Well-developed, well-nourished female in no acute distress.  Cardiovascular: normal rate Respiratory: normal effort GI: Abd soft, non-tender. Pos BS x 4 MS: Extremities nontender, no edema, normal ROM Neurologic: Alert and oriented x 4.  GU: Neg CVAT.  PELVIC EXAM: Cervix pink, visually closed, without lesion, vaginal walls and external genitalia normal      Tissue at os removed with ring forceps, but could not remove all  Bimanual exam: Cervix 1cm/long, clot or tissue barely palpable, but not reachable  LAB RESULTS No results found for this or any previous visit (from the past 24 hour(s)).  --/--/A POS Performed at [facility], [address] [place], [place] [address]  (02/26 1257)  IMAGING See below Prior imaging showed GS with no embryo, 6 wk size  MAU Management/MDM: Consult Dr Adrian Blackwater with presentation, exam findings, and results.   Treatments in MAU included Cytotec buccally Dilaudid given for pain Zofran and phenergan given for nausea/vomiting.  I removed quite a lot of tissue , bleeding is minimal and pain is greatly improved Sent for [place] to confirm passage  US showed GS is in lower uterine segment.  Discussed with patient and Dr Adrian Blackwater Will discharge home with Cytotec to use if she does not pass tissue within the next few days Also Rx Percocet and ibuprofen US Ob Transvaginal  Result Date: 10/04/2017 CLINICAL DATA:  Initial evaluation status post recent spontaneous abortion on 10/01/2017, persistent vaginal bleeding. EXAM: OBSTETRIC <14 WK [place] AND TRANSVAGINAL OB US TECHNIQUE: Both transabdominal and transvaginal ultrasound examinations were performed for complete evaluation of the gestation as well as the maternal uterus, adnexal regions, and pelvic cul-de-sac. Transvaginal technique was performed to assess early pregnancy.  COMPARISON:  Prior ultrasound from 10/01/2017 FINDINGS: Intrauterine gestational sac: Single gestational sac is seen at the level of the lower uterine segment. Surrounding endometrium is thickened up to 27.1 mm with areas of increased blood flow. Yolk sac:  Negative. Embryo:  Negative. Cardiac Activity: N/A Heart Rate: N/A  bpm MSD: 5.1 mm   5 w   2 d Subchorionic hemorrhage:  None visualized. Maternal uterus/adnexae: Ovaries are normal in appearance bilaterally. Small corpus luteal cyst noted on the left. No free fluid. IMPRESSION: 1. Incomplete SAB/retained products with retained gestational sac positioned at the level of the lower uterine segment. Surrounding endometrial thickening with scattered areas of blood flow. 2. No other acute maternal uterine or adnexal abnormality identified. Electronically Signed   By: Rise Mu M.D.   On: 10/04/2017 07:26   US Ob Transvaginal  Result Date: 10/01/2017 CLINICAL DATA:  27 y/o  F; bleeding and pain, pregnant patient. EXAM: TRANSVAGINAL OB ULTRASOUND TECHNIQUE: Transvaginal ultrasound was performed for complete evaluation of the gestation as well as the maternal uterus, adnexal regions, and pelvic cul-de-sac. COMPARISON:  09/12/2017 fetal ultrasound. FINDINGS: Intrauterine gestational sac: Single Yolk sac:  Visualized. Embryo:  Visualized. Yolk sac and embryo are difficult to distinguish on the current study Cardiac Activity: Not Visualized. Heart Rate: Absent CRL:   3.5 mm mm   6 w 0 d                  [place] EDC: 05/27/2018 Subchorionic hemorrhage:  None visualized. Maternal uterus/adnexae: Normal. IMPRESSION: Absence of embryo with a heart beat greater than 2 weeks after study demonstrating embryo without appreciable heartbeat. Interval decreased CRL. Findings compatible with definitive criteria for failed pregnancy. SRU consensus guidelines: Diagnostic Criteria for Nonviable Pregnancy Early in the First Trimester. Macy Mis J Med [phone].  Electronically Signed   By: Mitzi Hansen M.D.   On: 10/01/2017 17:17   US Ob Less Than 14 Weeks With Ob Transvaginal  Result Date: 09/12/2017 CLINICAL DATA:  Abdominal pain, first trimester pregnancy, LEFT side pain for and spotting for 1 day, quantitative beta HCG = 33,138 EXAM: OBSTETRIC <14 WK [place] AND TRANSVAGINAL OB US TECHNIQUE: Both transabdominal and transvaginal ultrasound examinations were performed for complete evaluation of the gestation as well as the maternal uterus, adnexal regions, and pelvic cul-de-sac. Transvaginal technique was performed to assess early pregnancy. COMPARISON:  None FINDINGS: Intrauterine gestational sac: Present, single Yolk sac:  Present Embryo:  Present Cardiac Activity: Not visualize Heart Rate: N/A  bpm CRL:  4.9 mm   6 w   1 d                  [place] EDC: 05/07/2018 Subchorionic hemorrhage:  None visualized. Maternal uterus/adnexae: RIGHT ovary normal size and morphology, 2.7 x 2.0 x 1.6 cm. LEFT ovary measures 2.7 x 3.6 x 2.4 cm and contains a small corpus luteal cyst. No adnexal masses or free pelvic fluid. IMPRESSION: Single intrauterine gestation with crown-rump length corresponding to 6 weeks 1 day EGA. However no fetal cardiac activity is identified. Findings are suspicious but not yet definitive for failed pregnancy. Recommend follow-up US in 10-14 days for definitive diagnosis. This recommendation follows SRU consensus guidelines: Diagnostic Criteria for Nonviable Pregnancy Early in the First Trimester. Malva Limes Med 2013; 119:1478-29. Electronically Signed   By: Ulyses Southward M.D.   On: 09/12/2017 14:47    ASSESSMENT Single IUP at [redacted]w[redacted]d, by LMP, 6 weeks by [place] Known missed abortion Abortion in process, incomplete  PLAN  discharge home Rx Cytotec for PRN use Rx Percocet for pain Rx Zofran for nausea Ibuprofen for pain Bleeding precautions Message sent to clinic for followup visit  Pt stable at time of discharge. Encouraged to return here or to  other Urgent Care/ED if she develops worsening of symptoms, increase in pain, fever, or other concerning symptoms.    Wynelle Bourgeois CNM, MSN Certified Nurse-Midwife 10/04/2017  3:56 AM

## 2017-10-04 NOTE — Discharge Instructions (Signed)

## 2017-10-04 NOTE — MAU Note (Addendum)
PT SAYS PAIN AND BLEEDING STARTED AT 3 PM .    TOOK IBUPROFEN  1 TAB AT 3 AND 7 PM   ON ARRIVAL- SMALL AMT  BLEEDING

## 2017-10-06 ENCOUNTER — Other Ambulatory Visit: Payer: Self-pay | Admitting: General Practice

## 2017-10-06 DIAGNOSIS — O039 Complete or unspecified spontaneous abortion without complication: Secondary | ICD-10-CM

## 2017-10-09 ENCOUNTER — Other Ambulatory Visit: Payer: Self-pay

## 2017-10-16 ENCOUNTER — Encounter: Payer: Self-pay | Admitting: Advanced Practice Midwife

## 2017-12-08 ENCOUNTER — Inpatient Hospital Stay (HOSPITAL_COMMUNITY)
Admission: AD | Admit: 2017-12-08 | Discharge: 2017-12-09 | Disposition: A | Discharge status: Home / Self Care | Payer: Medicaid Other | Source: Ambulatory Visit | Attending: Obstetrics & Gynecology | Admitting: Obstetrics & Gynecology

## 2017-12-08 ENCOUNTER — Encounter (HOSPITAL_COMMUNITY): Payer: Self-pay | Admitting: *Deleted

## 2017-12-08 DIAGNOSIS — O283 Abnormal ultrasonic finding on antenatal screening of mother: Secondary | ICD-10-CM | POA: Diagnosis not present

## 2017-12-08 DIAGNOSIS — O26891 Other specified pregnancy related conditions, first trimester: Secondary | ICD-10-CM | POA: Diagnosis not present

## 2017-12-08 DIAGNOSIS — O26859 Spotting complicating pregnancy, unspecified trimester: Secondary | ICD-10-CM | POA: Diagnosis present

## 2017-12-08 DIAGNOSIS — Z880 Allergy status to penicillin: Secondary | ICD-10-CM | POA: Diagnosis not present

## 2017-12-08 DIAGNOSIS — O3680X0 Pregnancy with inconclusive fetal viability, not applicable or unspecified: Secondary | ICD-10-CM

## 2017-12-08 LAB — URINALYSIS, ROUTINE W REFLEX MICROSCOPIC
BILIRUBIN URINE: NEGATIVE
GLUCOSE, UA: NEGATIVE mg/dL
KETONES UR: NEGATIVE mg/dL
LEUKOCYTES UA: NEGATIVE
NITRITE: NEGATIVE
PH: 5 (ref 5.0–8.0)
PROTEIN: NEGATIVE mg/dL
Specific Gravity, Urine: 1.027 (ref 1.005–1.030)

## 2017-12-08 LAB — CBC
HCT: 38 % (ref 36.0–46.0)
Hemoglobin: 12.7 g/dL (ref 12.0–15.0)
MCH: 33.1 pg (ref 26.0–34.0)
MCHC: 33.4 g/dL (ref 30.0–36.0)
MCV: 99 fL (ref 78.0–100.0)
PLATELETS: 320 10*3/uL (ref 150–400)
RBC: 3.84 MIL/uL — AB (ref 3.87–5.11)
RDW: 13.2 % (ref 11.5–15.5)
WBC: 7.8 10*3/uL (ref 4.0–10.5)

## 2017-12-08 LAB — HCG, QUANTITATIVE, PREGNANCY: hCG, Beta Chain, Quant, S: 8 m[IU]/mL — ABNORMAL HIGH (ref <5)

## 2017-12-08 LAB — POCT PREGNANCY, URINE: Preg Test, Ur: NEGATIVE

## 2017-12-08 MED ORDER — ONDANSETRON 8 MG PO TBDP
8.0000 mg | ORAL_TABLET | Freq: Once | ORAL | Status: AC
  Administered 2017-12-08: 8 mg via ORAL
  Filled 2017-12-08: qty 1

## 2017-12-08 NOTE — MAU Note (Signed)
Had SAB in March and then started BCPs. LMP 11/06/17 and got positive upt 3 days ago. Had pink spotting this am but none since. Here tonight due to vomiting. No pain.

## 2017-12-08 NOTE — MAU Provider Note (Addendum)
History     CSN: [id number]  Arrival date and time: 12/08/17 2139   First Provider Initiated Contact with Patient 12/08/17 2238      Chief Complaint  Patient presents with  . Vaginal Bleeding   Martha Johnson is a 27 y.o. G2P1001 who presents today with spotting, nausea/vomiting and +UPT at home. She had a SAB in March, and has been on OCPs. She had a +UPT 3 days ago, and today she has had some light pink spotting. She has also been nauseous and vomiting today.   Vaginal Bleeding  The patient's primary symptoms include vaginal bleeding. The patient's pertinent negatives include no pelvic pain or vaginal discharge. This is a new problem. The current episode started in the past 7 days. The problem occurs rarely. The problem has been resolved. The patient is experiencing no pain. She is not pregnant. Associated symptoms include nausea and vomiting. Pertinent negatives include no chills, dysuria, fever or frequency. Vaginal discharge characteristics: pink  The vaginal bleeding is spotting. She has not been passing clots. She has not been passing tissue. Nothing aggravates the symptoms. She has tried nothing for the symptoms. She uses oral contraceptives for contraception. Her menstrual history has been regular (LMP 11/07/17).    Past Medical History:  Diagnosis Date  . Medical history non-contributory     Past Surgical History:  Procedure Laterality Date  . LEEP    . NO PAST SURGERIES      History reviewed. No pertinent family history.  Social History   Tobacco Use  . Smoking status: Never Smoker  . Smokeless tobacco: Never Used  Substance Use Topics  . Alcohol use: No    Frequency: Never    Comment: occassional  . Drug use: No    Types: Marijuana    Comment: last used one wk ago    Allergies:  Allergies  Allergen Reactions  . Penicillins Other (See Comments)    Childhood reaction    Medications Prior to Admission  Medication Sig Dispense Refill Last Dose  .  acetaminophen (TYLENOL) 500 MG tablet Take 1,000 mg by mouth every 6 (six) hours as needed.    at 1700  . ibuprofen (ADVIL,MOTRIN) 800 MG tablet Take 1 tablet (800 mg total) by mouth 3 (three) times daily. 30 tablet 1 10/03/2017 at Unknown time  . metroNIDAZOLE (FLAGYL) 500 MG tablet Take 1 tablet (500 mg total) by mouth 2 (two) times daily. 14 tablet 0   . misoprostol (CYTOTEC) 200 MCG tablet Place four tablets in between your gums and cheeks (two tablets on each side) as instructed OR insert four tablets vaginally 4 tablet 1   . ondansetron (ZOFRAN ODT) 4 MG disintegrating tablet Take 1 tablet (4 mg total) by mouth every 6 (six) hours as needed for nausea. 20 tablet 0   . oxyCODONE-acetaminophen (PERCOCET/ROXICET) 5-325 MG tablet Take 1-2 tablets by mouth every 6 (six) hours as needed. 10 tablet 0     Review of Systems  Constitutional: Negative for chills and fever.  Gastrointestinal: Positive for nausea and vomiting.  Genitourinary: Positive for vaginal bleeding. Negative for dysuria, frequency, pelvic pain and vaginal discharge.   Physical Exam   unknown if currently breastfeeding.  Physical Exam  Nursing note and vitals reviewed. Constitutional: She is oriented to person, place, and time. She appears well-developed and well-nourished. No distress.  HENT:  Head: Normocephalic.  Cardiovascular: Normal rate.  Respiratory: Effort normal.  GI: Soft. There is no tenderness. There is no rebound.  Neurological:  She is alert and oriented to person, place, and time.  Skin: Skin is warm and dry.  Psychiatric: She has a normal mood and affect.   Results for orders placed or performed during the hospital encounter of 12/08/17 (from the past 24 hour(s))  Urinalysis, Routine w reflex microscopic     Status: Abnormal   Collection Time: 12/08/17 10:10 PM  Result Value Ref Range   Color, Urine YELLOW YELLOW   APPearance CLEAR CLEAR   Specific Gravity, Urine 1.027 1.005 - 1.030   pH 5.0 5.0 -  8.0   Glucose, UA NEGATIVE NEGATIVE mg/dL   Hgb urine dipstick SMALL (A) NEGATIVE   Bilirubin Urine NEGATIVE NEGATIVE   Ketones, ur NEGATIVE NEGATIVE mg/dL   Protein, ur NEGATIVE NEGATIVE mg/dL   Nitrite NEGATIVE NEGATIVE   Leukocytes, UA NEGATIVE NEGATIVE   RBC / HPF 0-5 0 - 5 RBC/hpf   WBC, UA 0-5 0 - 5 WBC/hpf   Bacteria, UA RARE (A) NONE SEEN   Squamous Epithelial / LPF 0-5 0 - 5   Mucus PRESENT   Pregnancy, urine POC     Status: None   Collection Time: 12/08/17 10:37 PM  Result Value Ref Range   Preg Test, Ur NEGATIVE NEGATIVE  CBC     Status: Abnormal   Collection Time: 12/08/17 10:46 PM  Result Value Ref Range   WBC 7.8 4.0 - 10.5 K/uL   RBC 3.84 (L) 3.87 - 5.11 MIL/uL   Hemoglobin 12.7 12.0 - 15.0 g/dL   HCT 16.1 09.6 - 04.5 %   MCV 99.0 78.0 - 100.0 fL   MCH 33.1 26.0 - 34.0 pg   MCHC 33.4 30.0 - 36.0 g/dL   RDW 40.9 81.1 - 91.4 %   Platelets 320 150 - 400 K/uL  hCG, quantitative, pregnancy     Status: Abnormal   Collection Time: 12/08/17 10:46 PM  Result Value Ref Range   hCG, Beta Chain, Quant, S 8 (H) <5 mIU/mL    MAU Course  Procedures  MDM DW patient about possible retained POCs from SAB v new pregnancy. Questions answered. Plan for patient to return in 48 hours for HCG to see if there is a trending pattern.   Assessment and Plan   1. Pregnancy, location unknown    DC home Comfort measures reviewed  Bleeding precautions Ectopic precautions RX: none  FU HCG in 48 hours   Follow-up Information    THE [facility] [place] MATERNITY ADMISSIONS Follow up.   Why:  Monday morning for repeat blood work  Contact information: [address] [id number] [place] [address] [phone]          Thressa Sheller 12/08/2017, 10:40 PM

## 2017-12-09 DIAGNOSIS — O283 Abnormal ultrasonic finding on antenatal screening of mother: Secondary | ICD-10-CM | POA: Diagnosis not present

## 2017-12-09 NOTE — Progress Notes (Signed)
Thressa Sheller CNM in earlier to discuss lab results and plan of care. Written and verbal d/c instructions given and understanding voiced.

## 2018-06-21 ENCOUNTER — Emergency Department (HOSPITAL_COMMUNITY)
Admission: EM | Admit: 2018-06-21 | Discharge: 2018-06-21 | Disposition: A | Discharge status: Home / Self Care | Payer: Medicaid Other | Attending: Emergency Medicine | Admitting: Emergency Medicine

## 2018-06-21 ENCOUNTER — Emergency Department (HOSPITAL_COMMUNITY): Payer: Medicaid Other

## 2018-06-21 ENCOUNTER — Other Ambulatory Visit: Payer: Self-pay

## 2018-06-21 DIAGNOSIS — R300 Dysuria: Secondary | ICD-10-CM | POA: Diagnosis present

## 2018-06-21 DIAGNOSIS — N3000 Acute cystitis without hematuria: Secondary | ICD-10-CM | POA: Diagnosis not present

## 2018-06-21 DIAGNOSIS — Z79899 Other long term (current) drug therapy: Secondary | ICD-10-CM | POA: Diagnosis not present

## 2018-06-21 DIAGNOSIS — N760 Acute vaginitis: Secondary | ICD-10-CM | POA: Insufficient documentation

## 2018-06-21 DIAGNOSIS — B9689 Other specified bacterial agents as the cause of diseases classified elsewhere: Secondary | ICD-10-CM

## 2018-06-21 LAB — WET PREP, GENITAL
Sperm: NONE SEEN
Trich, Wet Prep: NONE SEEN
Yeast Wet Prep HPF POC: NONE SEEN

## 2018-06-21 LAB — URINALYSIS, ROUTINE W REFLEX MICROSCOPIC
BILIRUBIN URINE: NEGATIVE
GLUCOSE, UA: NEGATIVE mg/dL
KETONES UR: NEGATIVE mg/dL
NITRITE: NEGATIVE
PROTEIN: NEGATIVE mg/dL
Specific Gravity, Urine: 1.018 (ref 1.005–1.030)
pH: 6 (ref 5.0–8.0)

## 2018-06-21 LAB — POC URINE PREG, ED: Preg Test, Ur: NEGATIVE

## 2018-06-21 MED ORDER — CEPHALEXIN 500 MG PO CAPS: 500.0000 mg | ORAL_CAPSULE | Freq: Two times a day (BID) | ORAL | 0 refills | Status: AC

## 2018-06-21 MED ORDER — METRONIDAZOLE 500 MG PO TABS: 500.0000 mg | ORAL_TABLET | Freq: Two times a day (BID) | ORAL | 0 refills | Status: DC

## 2018-06-21 MED ORDER — METRONIDAZOLE 500 MG PO TABS
500.0000 mg | ORAL_TABLET | Freq: Once | ORAL | Status: AC
  Administered 2018-06-21: 500 mg via ORAL
  Filled 2018-06-21: qty 1

## 2018-06-21 MED ORDER — CEPHALEXIN 250 MG PO CAPS
500.0000 mg | ORAL_CAPSULE | Freq: Once | ORAL | Status: AC
  Administered 2018-06-21: 500 mg via ORAL
  Filled 2018-06-21: qty 2

## 2018-06-21 NOTE — ED Notes (Signed)
Pt refusing HIV blood work, reports she had testing for this within the last month.

## 2018-06-21 NOTE — Discharge Instructions (Addendum)
Please see the information and instructions below regarding your visit.  Your diagnoses today include:  1. Acute cystitis without hematuria   2. Bacterial vaginosis    Your symptoms are caused by a urinary tract infection, which occurs when bacteria travel up into the bladder. This is a very common condition. Often, bacteria is transmitted while wiping after using the restroom. It is reassuring that you do not have a fever today.  If you still have concerns about pregnancy, please retake a pregnancy test in 10 days.    Tests performed today include: See side panel of your discharge paperwork for testing performed today. Vital signs are listed at the bottom of these instructions.  -Urine tests. Suggestive of infection.  You tested for gonorrhea and chlamydia today.  These results will be available in 2 to 3 days.  You will receive a call for any positive results.  You will not receive a call for negative results.  Please do not have any unprotected sexual intercourse for 1 week while these results are pending.  Medications prescribed:    Take any prescribed medications only as prescribed, and any over the counter medications only as directed on the packaging.  Please take all of your antibiotics until finished.   You may develop abdominal discomfort or nausea from the antibiotic. If this occurs, you may take it with food. Some patients also get diarrhea with antibiotics. You may help offset this with probiotics which you can buy or get in yogurt. Do not eat or take the probiotics until 2 hours after your antibiotic. Some women develop vaginal yeast infections after antibiotics. If you develop unusual vaginal discharge after being on this medication, please see your primary care provider.   Some people develop allergies to antibiotics. Symptoms of antibiotic allergy can be mild and include a flat rash and itching. They can also be more serious and include:  ?Hives - Hives are raised, red  patches of skin that are usually very itchy.  ?Lip or tongue swelling  ?Trouble swallowing or breathing  ?Blistering of the skin or mouth.  If you have any of these serious symptoms, please seek emergency medical care immediately.  Flagyl is an antibiotic used to treat bacterial vaginosis. This medication CANNOT be taken with alcohol, because it can cause nausea and vomiting combined with alcohol. Please also refrain from drinking alcohol for 48 hours after you finish the medicine.   Home care instructions:  Please follow any educational materials contained in this packet.  Please stay hydrated by making sure that your urine is very light yellow in color.    Follow-up instructions: Please follow-up with your primary care provider in one week for further evaluation of your symptoms.   Return instructions:  Please return to the Emergency Department if you experience worsening symptoms. Please seek immediate care if you develop the following: Your symptoms are no better or worse in 3 days. There is severe back pain or lower abdominal pain.  You develop chills.  You have a fever.  There is nausea or vomiting.  There is continued burning or discomfort with urination.  You have any additional concerns.  Please return if you have any other emergent concerns.  Additional Information:   Your vital signs today were: BP 114/76 (BP Location: Right Arm)    Pulse 92    Temp 98.3 F (36.8 C) (Oral)    Resp 16    SpO2 97%  If your blood pressure (BP) was elevated on multiple  readings during this visit above 130 for the top number or above 80 for the bottom number, please have this repeated by your primary care provider within one month. --------------  Thank you for allowing [place] to participate in your care today.

## 2018-06-21 NOTE — ED Triage Notes (Signed)
Pt endorses having a condom in her vagina x 2 days that she has been unable to get out. Also endorses some burning when urinating since then.

## 2018-06-22 LAB — GC/CHLAMYDIA PROBE AMP (~~LOC~~) NOT AT ARMC
CHLAMYDIA, DNA PROBE: NEGATIVE
NEISSERIA GONORRHEA: POSITIVE — AB

## 2018-06-22 NOTE — ED Provider Notes (Signed)
[facility] EMERGENCY DEPARTMENT Provider Note   CSN: [id number] Arrival date & time: 06/21/18  1738     History   Chief Complaint Chief Complaint  Patient presents with  . Foreign Body    HPI Martha Johnson is a 27 y.o. female.  HPI   Patient is a 27 year old female with no sniffing past medical history presenting for possible foreign body in the vagina.  She reports that she had sexual intercourse with a female partner 3 days ago, and the condom came off.  She reports that she has had some dysuria, urgency, frequency, and suprapubic discomfort over the past few days.  She attributes this to possible retained condom foreign body.  She also reports a mild amount of vaginal discharge, but denies any odor.  Patient denies any flank pain, nausea, vomiting, fever, chills, or vaginal bleeding.  Patient reports that her menses are regular.  She does not believe that she could be pregnant, but would like to be tested.  Patient reports that she is currently sexually active with one female partner.  No other sexual partners in the past 2 months.  Past Medical History:  Diagnosis Date  . Medical history non-contributory     There are no active problems to display for this patient.   Past Surgical History:  Procedure Laterality Date  . LEEP    . NO PAST SURGERIES       OB History    Gravida  2   Para  1   Term  1   Preterm      AB      Living  1     SAB      TAB      Ectopic      Multiple      Live Births  1            Home Medications    Prior to Admission medications   Medication Sig Start Date End Date Taking? Authorizing Provider  acetaminophen (TYLENOL) 500 MG tablet Take 1,000 mg by mouth every 6 (six) hours as needed.    [provider]  cephALEXin (KEFLEX) 500 MG capsule Take 1 capsule (500 mg total) by mouth 2 (two) times daily for 7 days. 06/21/18 06/28/18  Aviva Kluver B, PA-C  metroNIDAZOLE (FLAGYL) 500 MG tablet Take  1 tablet (500 mg total) by mouth 2 (two) times daily. 06/21/18   Elisha Ponder, PA-C    Family History No family history on file.  Social History Social History   Tobacco Use  . Smoking status: Never Smoker  . Smokeless tobacco: Never Used  Substance Use Topics  . Alcohol use: No    Frequency: Never    Comment: occassional  . Drug use: No    Types: Marijuana    Comment: last used one wk ago     Allergies   Penicillins   Review of Systems Review of Systems  Constitutional: Negative for chills and fever.  Gastrointestinal: Negative for abdominal pain, nausea and vomiting.  Genitourinary: Positive for dysuria, frequency and vaginal discharge. Negative for difficulty urinating, flank pain, urgency, vaginal bleeding and vaginal pain.  All other systems reviewed and are negative.    Physical Exam Updated Vital Signs BP 114/78 (BP Location: Right Arm)   Pulse 81   Temp 97.6 F (36.4 C) (Oral)   Resp 19   SpO2 100%   Physical Exam  Constitutional: She appears well-developed and well-nourished. No distress.  HENT:  Head: Normocephalic and atraumatic.  Mouth/Throat: Oropharynx is clear and moist.  Eyes: Pupils are equal, round, and reactive to light. Conjunctivae and EOM are normal.  Neck: Normal range of motion. Neck supple.  Cardiovascular: Normal rate, regular rhythm, S1 normal and S2 normal.  No murmur heard. Pulmonary/Chest: Effort normal and breath sounds normal. She has no wheezes. She has no rales.  Abdominal: Soft. She exhibits no distension. There is tenderness. There is no guarding.  Mild suprapubic tenderness to palpation.  No guarding or rebound.  Patient reporting pain is slightly worse to the left of suprapubic region.  Genitourinary:  Genitourinary Comments: Pelvic examination performed with RN chaperone present.  No external lesions or lymphadenopathy of the vagina or perineum.  Vaginal tissue is pink and rugated. No foreign body identified in vaginal  vault. Cervix is not erythematous and nonfriable.  There is small amount of milky white discharge surrounding cervical os.  On bimanual exam, patient has no cervical motion tenderness, has discomfort to palpation of the suprapubic region, but has no focal adnexal tenderness.  Musculoskeletal: Normal range of motion. She exhibits no edema or deformity.  Neurological: She is alert.  Cranial nerves grossly intact. Patient moves extremities symmetrically and with good coordination.  Skin: Skin is warm and dry. No rash noted. No erythema.  Psychiatric: She has a normal mood and affect. Her behavior is normal. Judgment and thought content normal.  Nursing note and vitals reviewed.    ED Treatments / Results  Labs (all labs ordered are listed, but only abnormal results are displayed) Labs Reviewed  WET PREP, GENITAL - Abnormal; Notable for the following components:      Result Value   Clue Cells Wet Prep HPF POC PRESENT (*)    WBC, Wet Prep HPF POC FEW (*)    All other components within normal limits  URINALYSIS, ROUTINE W REFLEX MICROSCOPIC - Abnormal; Notable for the following components:   APPearance CLOUDY (*)    Hgb urine dipstick SMALL (*)    Leukocytes, UA LARGE (*)    WBC, UA >50 (*)    Bacteria, UA FEW (*)    All other components within normal limits  RPR  HIV ANTIBODY (ROUTINE TESTING W REFLEX)  POC URINE PREG, ED  GC[facility]LAM[facility]DIA PROBE AMP () [facility]    EKG None  Radiology [place] Transvaginal Non-ob  Result Date: 06/21/2018 CLINICAL DATA:  LEFT adnexal pain. Evaluate for possible tubo-ovarian abscess. LMP 06/09/2018. EXAM: TRANSABDOMINAL AND TRANSVAGINAL ULTRASOUND OF PELVIS DOPPLER ULTRASOUND OF OVARIES TECHNIQUE: Both transabdominal and transvaginal ultrasound examinations of the pelvis were performed. Transabdominal technique was performed for global imaging of the pelvis including uterus, ovaries, adnexal regions, and pelvic cul-de-sac. It was necessary to  proceed with endovaginal exam following the transabdominal exam to visualize the uterus, ovaries, adnexal regions. Color and duplex Doppler ultrasound was utilized to evaluate blood flow to the ovaries. COMPARISON:  10/04/2017 OB ultrasound FINDINGS: Uterus Measurements: 8.7 x 3.5 x 4.6 centimeter = volume: 75.6 mL. No fibroids or other mass visualized. Anteverted. Endometrium Thickness: 8.5 millimeters.  No focal abnormality visualized. Right ovary Measurements: 2.7 x 2.2 x 2.2 centimeter = volume: 7 mL. Normal appearance/no adnexal mass. Left ovary Measurements: 2.2 x 1.5 x 2.0 centimeters = volume: 3.8 mL. Normal appearance/no adnexal mass. Pulsed Doppler evaluation of both ovaries demonstrates normal low-resistance arterial and venous waveforms. Other findings No abnormal free fluid. IMPRESSION: Normal pelvic ultrasound. Electronically Signed   By: Norva PavlovElizabeth  Brown M.D.  On: 06/21/2018 21:17   US Pelvis Complete  Result Date: 06/21/2018 CLINICAL DATA:  LEFT adnexal pain. Evaluate for possible tubo-ovarian abscess. LMP 06/09/2018. EXAM: TRANSABDOMINAL AND TRANSVAGINAL ULTRASOUND OF PELVIS DOPPLER ULTRASOUND OF OVARIES TECHNIQUE: Both transabdominal and transvaginal ultrasound examinations of the pelvis were performed. Transabdominal technique was performed for global imaging of the pelvis including uterus, ovaries, adnexal regions, and pelvic cul-de-sac. It was necessary to proceed with endovaginal exam following the transabdominal exam to visualize the uterus, ovaries, adnexal regions. Color and duplex Doppler ultrasound was utilized to evaluate blood flow to the ovaries. COMPARISON:  10/04/2017 OB ultrasound FINDINGS: Uterus Measurements: 8.7 x 3.5 x 4.6 centimeter = volume: 75.6 mL. No fibroids or other mass visualized. Anteverted. Endometrium Thickness: 8.5 millimeters.  No focal abnormality visualized. Right ovary Measurements: 2.7 x 2.2 x 2.2 centimeter = volume: 7 mL. Normal appearance/no adnexal  mass. Left ovary Measurements: 2.2 x 1.5 x 2.0 centimeters = volume: 3.8 mL. Normal appearance/no adnexal mass. Pulsed Doppler evaluation of both ovaries demonstrates normal low-resistance arterial and venous waveforms. Other findings No abnormal free fluid. IMPRESSION: Normal pelvic ultrasound. Electronically Signed   By: Norva Pavlov M.D.   On: 06/21/2018 21:17   [place] Art/ven Flow Abd Pelv Doppler  Result Date: 06/21/2018 CLINICAL DATA:  LEFT adnexal pain. Evaluate for possible tubo-ovarian abscess. LMP 06/09/2018. EXAM: TRANSABDOMINAL AND TRANSVAGINAL ULTRASOUND OF PELVIS DOPPLER ULTRASOUND OF OVARIES TECHNIQUE: Both transabdominal and transvaginal ultrasound examinations of the pelvis were performed. Transabdominal technique was performed for global imaging of the pelvis including uterus, ovaries, adnexal regions, and pelvic cul-de-sac. It was necessary to proceed with endovaginal exam following the transabdominal exam to visualize the uterus, ovaries, adnexal regions. Color and duplex Doppler ultrasound was utilized to evaluate blood flow to the ovaries. COMPARISON:  10/04/2017 OB ultrasound FINDINGS: Uterus Measurements: 8.7 x 3.5 x 4.6 centimeter = volume: 75.6 mL. No fibroids or other mass visualized. Anteverted. Endometrium Thickness: 8.5 millimeters.  No focal abnormality visualized. Right ovary Measurements: 2.7 x 2.2 x 2.2 centimeter = volume: 7 mL. Normal appearance/no adnexal mass. Left ovary Measurements: 2.2 x 1.5 x 2.0 centimeters = volume: 3.8 mL. Normal appearance/no adnexal mass. Pulsed Doppler evaluation of both ovaries demonstrates normal low-resistance arterial and venous waveforms. Other findings No abnormal free fluid. IMPRESSION: Normal pelvic ultrasound. Electronically Signed   By: Norva Pavlov M.D.   On: 06/21/2018 21:17    Procedures Procedures (including critical care time)  Medications Ordered in ED Medications  metroNIDAZOLE (FLAGYL) tablet 500 mg (500 mg Oral Given  06/21/18 2205)  cephALEXin (KEFLEX) capsule 500 mg (500 mg Oral Given 06/21/18 2205)     Initial Impression / Assessment and Plan / ED Course  I have reviewed the triage vital signs and the nursing notes.  Pertinent labs & imaging results that were available during my care of the patient were reviewed by me and considered in my medical decision making (see chart for details).  Clinical Course as of Jun 22 45  Thu Jun 21, 2018  1946 Given recent possible STI exposure, could reflect either STI or UTI. Will continue to check pelvic US.   Leukocytes, UA(!): LARGE [AM]    Clinical Course User Index [AM] Elisha Ponder, PA-C    Patient nontoxic-appearing, afebrile, and with benign abdomen.  Differential diagnosis includes cystitis, PID, cervicitis, retained foreign body of the vagina.   On examination, no foreign body is identified.  Patient's pelvic examination not concerning for PID, but given patient's suprapubic discomfort, and  concern about STI, as well as report of possible worse left sided than right sided pelvic discomfort, however the ultrasound ordered.  Without abnormality.  Patient has urinalysis demonstrating cystitis.  I feel that this is representative of a primary urinary tract infection, and not sexually transmitted infection.  WBC clumps present, patient does not express any flank pain, fevers, nausea or vomiting or other systemic symptoms to suggest pyelonephritis.  Wet prep is demonstrating clue cells, and few WBCs.  Do not feel this is indicative of STI.  Patient treated with Keflex, and Flagyl.  Patient instructed not to drink alcohol while taking Flagyl.  Return precautions given for any flank pain, fever or chills, nausea or vomiting.  Patient instructed that STI testing is pending.  Declined HIV and syphilis testing.  Patient instructed she will receive a call for any positive results.  Patient is in understanding and agrees with plan of care.  Final Clinical  Impressions(s) / ED Diagnoses   Final diagnoses:  Acute cystitis without hematuria  Bacterial vaginosis    ED Discharge Orders         Ordered    cephALEXin (KEFLEX) 500 MG capsule  2 times daily     06/21/18 2201    metroNIDAZOLE (FLAGYL) 500 MG tablet  2 times daily     06/21/18 2201           Delia Chimes 06/22/18 0059    Raeford Razor, MD 06/22/18 [phone]

## 2018-06-27 ENCOUNTER — Ambulatory Visit: Payer: Medicaid Other

## 2018-07-05 ENCOUNTER — Emergency Department (HOSPITAL_COMMUNITY)
Admission: EM | Admit: 2018-07-05 | Discharge: 2018-07-05 | Disposition: A | Discharge status: Home / Self Care | Payer: Medicaid Other | Attending: Emergency Medicine | Admitting: Emergency Medicine

## 2018-07-05 ENCOUNTER — Emergency Department (HOSPITAL_COMMUNITY): Payer: Medicaid Other

## 2018-07-05 ENCOUNTER — Other Ambulatory Visit: Payer: Self-pay

## 2018-07-05 ENCOUNTER — Encounter (HOSPITAL_COMMUNITY): Payer: Self-pay | Admitting: Emergency Medicine

## 2018-07-05 DIAGNOSIS — S99911A Unspecified injury of right ankle, initial encounter: Secondary | ICD-10-CM | POA: Diagnosis present

## 2018-07-05 DIAGNOSIS — Y939 Activity, unspecified: Secondary | ICD-10-CM | POA: Insufficient documentation

## 2018-07-05 DIAGNOSIS — Y33XXXA Other specified events, undetermined intent, initial encounter: Secondary | ICD-10-CM | POA: Insufficient documentation

## 2018-07-05 DIAGNOSIS — Y929 Unspecified place or not applicable: Secondary | ICD-10-CM | POA: Diagnosis not present

## 2018-07-05 DIAGNOSIS — S93401A Sprain of unspecified ligament of right ankle, initial encounter: Secondary | ICD-10-CM | POA: Insufficient documentation

## 2018-07-05 DIAGNOSIS — Y998 Other external cause status: Secondary | ICD-10-CM | POA: Insufficient documentation

## 2018-07-05 MED ORDER — OXYCODONE-ACETAMINOPHEN 5-325 MG PO TABS
1.0000 | ORAL_TABLET | Freq: Once | ORAL | Status: AC
  Administered 2018-07-05: 1 via ORAL
  Filled 2018-07-05: qty 1

## 2018-07-05 MED ORDER — IBUPROFEN 800 MG PO TABS: 800.0000 mg | ORAL_TABLET | Freq: Three times a day (TID) | ORAL | 0 refills | Status: DC | PRN

## 2018-07-05 NOTE — ED Provider Notes (Signed)
Emergency Department Provider Note   I have reviewed the triage vital signs and the nursing notes.   HISTORY  Chief Complaint No chief complaint on file.   HPI Martha Johnson is a 27 y.o. female with no significant PMH presents to the emergency department for evaluation of right ankle pain.  Injury occurred last night.  Patient stepped wrong and rolled the ankle.  She woke up this morning and got up to urinate and felt severe pain when she stepped down.  She is found it very difficult to walk. Pain is severe. She is not experiencing numbness or tingling.  No pain in the knee.  No fall or head trauma.  No fevers or chills.   Past Medical History:  Diagnosis Date  . Medical history non-contributory     There are no active problems to display for this patient.   Past Surgical History:  Procedure Laterality Date  . LEEP    . NO PAST SURGERIES     Allergies Penicillins  No family history on file.  Social History Social History   Tobacco Use  . Smoking status: Never Smoker  . Smokeless tobacco: Never Used  Substance Use Topics  . Alcohol use: No    Frequency: Never    Comment: occassional  . Drug use: No    Types: Marijuana    Comment: last used one wk ago    Review of Systems  Constitutional: No fever/chills Eyes: No visual changes. ENT: No sore throat. Cardiovascular: Denies chest pain. Respiratory: Denies shortness of breath. Gastrointestinal: No abdominal pain.  No nausea, no vomiting.  No diarrhea.  No constipation. Genitourinary: Negative for dysuria. Musculoskeletal: Negative for back pain. Positive right ankle pain.  Skin: Negative for rash. Neurological: Negative for headaches, focal weakness or numbness.  10-point ROS otherwise negative.  ____________________________________________   PHYSICAL EXAM:  VITAL SIGNS: ED Triage Vitals  Enc Vitals Group     BP 07/05/18 1750 (!) 139/101     Pulse Rate 07/05/18 1750 95     Resp 07/05/18 1750  17     Temp 07/05/18 1750 97.6 F (36.4 C)     Temp Source 07/05/18 1750 Oral     SpO2 07/05/18 1750 100 %     Weight 07/05/18 1804 188 lb 0.8 oz (85.3 kg)     Height 07/05/18 1804 5\' 10"  (1.778 m)     Pain Score 07/05/18 1750 10   Constitutional: Alert and oriented. Well appearing and in no acute distress. Eyes: Conjunctivae are normal. Head: Atraumatic. Nose: No congestion/rhinnorhea. Mouth/Throat: Mucous membranes are moist. Neck: No stridor.   Cardiovascular: Good peripheral circulation.  Respiratory: Normal respiratory effort.  Gastrointestinal: No distention.  Musculoskeletal: Right ankle tenderness and swelling. No open fracture. Normal DP/PT pulses.  Neurologic:  Normal speech and language. No gross focal neurologic deficits are appreciated.  Skin:  Skin is warm, dry and intact. No rash noted.  ____________________________________________   LABS (all labs ordered are listed, but only abnormal results are displayed)  Labs Reviewed  PREGNANCY, URINE   ____________________________________________  RADIOLOGY  Dg Ankle Complete Right  Result Date: 07/05/2018 CLINICAL DATA:  Right foot and ankle pain after falling down stairs. EXAM: RIGHT ANKLE - COMPLETE 3+ VIEW COMPARISON:  Right foot radiographs obtained at the same time. FINDINGS: Diffuse soft tissue swelling at the level of the ankle and proximal foot. No fracture or dislocation seen. No definite effusion. IMPRESSION: No fracture. Electronically Signed   By: Zada FindersSteven  Reid M.D.  On: 07/05/2018 19:24   Dg Foot Complete Right  Result Date: 07/05/2018 CLINICAL DATA:  Right foot and ankle pain after falling down stairs. EXAM: RIGHT FOOT COMPLETE - 3+ VIEW COMPARISON:  Right ankle radiographs obtained at the same time. FINDINGS: Proximal soft tissue swelling. No fracture or dislocation seen. IMPRESSION: No fracture. Electronically Signed   By: Beckie SaltsSteven  Reid M.D.   On: 07/05/2018 19:23     ____________________________________________   PROCEDURES  Procedure(s) performed:   Procedures  None ____________________________________________   INITIAL IMPRESSION / ASSESSMENT AND PLAN / ED COURSE  Pertinent labs & imaging results that were available during my care of the patient were reviewed by me and considered in my medical decision making (see chart for details).  Patient presents to the ED for evaluation of ankle pain after turning ankle.  There is swelling and bruising.  Plain film shows no acute fracture.  Patient placed in Aircast and given crutches with instructions for RICE and PCP follow up.    ____________________________________________  FINAL CLINICAL IMPRESSION(S) / ED DIAGNOSES  Final diagnoses:  Sprain of right ankle, unspecified ligament, initial encounter     MEDICATIONS GIVEN DURING THIS VISIT:  Medications  oxyCODONE-acetaminophen (PERCOCET/ROXICET) 5-325 MG per tablet 1 tablet (1 tablet Oral Given 07/05/18 1914)     NEW OUTPATIENT MEDICATIONS STARTED DURING THIS VISIT:  Discharge Medication List as of 07/05/2018  7:45 PM    START taking these medications   Details  ibuprofen (ADVIL,MOTRIN) 800 MG tablet Take 1 tablet (800 mg total) by mouth every 8 (eight) hours as needed., Starting Thu 07/05/2018, Normal        Note:  This document was prepared using Dragon voice recognition software and may include unintentional dictation errors.  Alona BeneJoshua Zyona Pettaway, MD Emergency Medicine    Charron Coultas, Arlyss RepressJoshua G, MD 07/05/18 [phone]

## 2018-07-05 NOTE — ED Notes (Signed)
Pt has been taken to radiology, will medicate once she returns

## 2018-07-05 NOTE — ED Triage Notes (Signed)
Patient reports that she fell yesterday as she was walking up the steps. She states "I missed a step, I think it's my ligament, and I hit my knee also." She reports a pain level of 10 on a 0-10scale. She said that she has taken multiple OTC meds for her pain without results.  Patient's right ankle appears swollen, pulses palpable, she moves her toes, cap refill is less than 2secs.

## 2018-07-05 NOTE — Progress Notes (Signed)
Orthopedic Tech Progress Note Patient Details:  Martha GoodyMelenie Johnson 04/08/1991 [id number]  Ortho Devices Type of Ortho Device: Ankle Air splint, Crutches Ortho Device/Splint Interventions: Application, Adjustment, Ordered   Post Interventions Patient Tolerated: Well Instructions Provided: Poper ambulation with device, Care of device, Adjustment of device   Martha Johnson, Martha Johnson T 07/05/2018, 8:38 PM

## 2018-07-05 NOTE — Discharge Instructions (Signed)
As we discussed, you do not have any broken or dislocated bones in your foot or ankle, but you do have an ankle sprain. There is always a chance that a small fracture did not appear on today's x-ray. Please read through the included information about routine injury care (RICE = rest, ice, compression, elevation), and take over-the-counter pain medicine according to label instructions.  If you do not have any reason to avoid ibuprofen, you can also consider taking ibuprofen 600 mg 3 times a day with meals, but do this for no more than 5 days as it may cause to some stomach discomfort over time.  Use crutches if provided and you may bear weight as tolerated.  Follow-up is recommended with the orthopedic surgeon or with your regular doctor. ° ° °Ankle Sprain °An ankle sprain is an injury to the strong, fibrous tissues (ligaments) that hold the bones of your ankle joint together.  °CAUSES °An ankle sprain is usually caused by a fall or by twisting your ankle. Ankle sprains most commonly occur when you step on the outer edge of your foot, and your ankle turns inward. People who participate in sports are more prone to these types of injuries.  °SYMPTOMS  °Pain in your ankle. The pain may be present at rest or only when you are trying to stand or walk. °Swelling. °Bruising. Bruising may develop immediately or within 1 to 2 days after your injury. °Difficulty standing or walking, particularly when turning corners or changing directions. °DIAGNOSIS  °Your caregiver will ask you details about your injury and perform a physical exam of your ankle to determine if you have an ankle sprain. During the physical exam, your caregiver will press on and apply pressure to specific areas of your foot and ankle. Your caregiver will try to move your ankle in certain ways. An X-ray exam may be done to be sure a bone was not broken or a ligament did not separate from one of the bones in your ankle (avulsion fracture).  °TREATMENT  °Certain  types of braces can help stabilize your ankle. Your caregiver can make a recommendation for this. Your caregiver may recommend the use of medicine for pain. If your sprain is severe, your caregiver may refer you to a surgeon who helps to restore function to parts of your skeletal system (orthopedist) or a physical therapist. °HOME CARE INSTRUCTIONS  °Apply ice to your injury for 1-2 days or as directed by your caregiver. Applying ice helps to reduce inflammation and pain. °Put ice in a plastic bag. °Place a towel between your skin and the bag. °Leave the ice on for 15-20 minutes at a time, every 2 hours while you are awake. °Only take over-the-counter or prescription medicines for pain, discomfort, or fever as directed by your caregiver. °Elevate your injured ankle above the level of your heart as much as possible for 2-3 days. °If your caregiver recommends crutches, use them as instructed. Gradually put weight on the affected ankle. Continue to use crutches or a cane until you can walk without feeling pain in your ankle. °If you have a plaster splint, wear the splint as directed by your caregiver. Do not rest it on anything harder than a pillow for the first 24 hours. Do not put weight on it. Do not get it wet. You may take it off to take a shower or bath. °You may have been given an elastic bandage to wear around your ankle to provide support. If the elastic bandage   is too tight (you have numbness or tingling in your foot or your foot becomes cold and blue), adjust the bandage to make it comfortable. °If you have an air splint, you may blow more air into it or let air out to make it more comfortable. You may take your splint off at night and before taking a shower or bath. Wiggle your toes in the splint several times per day to decrease swelling. °SEEK MEDICAL CARE IF:  °You have rapidly increasing bruising or swelling. °Your toes feel extremely cold or you lose feeling in your foot. °Your pain is not relieved  with medicine. °SEEK IMMEDIATE MEDICAL CARE IF: °Your toes are numb or blue. °You have severe pain that is increasing. °MAKE SURE YOU:  °Understand these instructions. °Will watch your condition. °Will get help right away if you are not doing well or get worse. °  °This information is not intended to replace advice given to you by your health care provider. Make sure you discuss any questions you have with your health care provider. °  °Document Released: 07/04/2005 Document Revised: 07/25/2014 Document Reviewed: 07/16/2011 °Elsevier Interactive Patient Education ©2016 Elsevier Inc[employer] ° °Elastic Bandage and RICE °WHAT DOES AN ELASTIC BANDAGE DO? °Elastic bandages come in different shapes and sizes. They generally provide support to your injury and reduce swelling while you are healing, but they can perform different functions. Your health care provider will help you to decide what is best for your protection, recovery, or rehabilitation following an injury. °WHAT ARE SOME GENERAL TIPS FOR USING AN ELASTIC BANDAGE? °Use the bandage as directed by the maker of the bandage that you are using. °Do not wrap the bandage too tightly. This may cut off the circulation in the arm or leg in the area below the bandage. °If part of your body beyond the bandage becomes blue, numb, cold, swollen, or is more painful, your bandage is most likely too tight. If this occurs, remove your bandage and reapply it more loosely. °See your health care provider if the bandage seems to be making your problems worse rather than better. °An elastic bandage should be removed and reapplied every 3-4 hours or as directed by your health care provider. °WHAT IS RICE? °The routine care of many injuries includes rest, ice, compression, and elevation (RICE therapy).  °Rest °Rest is required to allow your body to heal. Generally, you can resume your routine activities when you are comfortable and have been given permission by your health care  provider. °Ice °Icing your injury helps to keep the swelling down and it reduces pain. Do not apply ice directly to your skin. °Put ice in a plastic bag. °Place a towel between your skin and the bag. °Leave the ice on for 20 minutes, 2-3 times per day. °Do this for as [place] as you are directed by your health care provider. °Compression °Compression helps to keep swelling down, gives support, and helps with discomfort. Compression may be done with an elastic bandage. °Elevation °Elevation helps to reduce swelling and it decreases pain. If possible, your injured area should be placed at or above the level of your heart or the center of your chest. °WHEN SHOULD I SEEK MEDICAL CARE? °You should seek medical care if: °You have persistent pain and swelling. °Your symptoms are getting worse rather than improving. °These symptoms may indicate that further evaluation or further X-rays are needed. Sometimes, X-rays may not show a small broken bone (fracture) until a number of days later. Make   a follow-up appointment with your health care provider. Ask when your X-ray results will be ready. Make sure that you get your X-ray results. °WHEN SHOULD I SEEK IMMEDIATE MEDICAL CARE? °You should seek immediate medical care if: °You have a sudden onset of severe pain at or below the area of your injury. °You develop redness or increased swelling around your injury. °You have tingling or numbness at or below the area of your injury that does not improve after you remove the elastic bandage. °  °This information is not intended to replace advice given to you by your health care provider. Make sure you discuss any questions you have with your health care provider. °  °Document Released: 12/24/2001 Document Revised: 03/25/2015 Document Reviewed: 02/17/2014 °Elsevier Interactive Patient Education ©2016 Elsevier Inc. ° °  °

## 2019-08-11 IMAGING — DX DG FOOT COMPLETE 3+V*R*
3 series · 3 of 3 positions shown · non-contrast
Comparison: Right ankle radiographs obtained at the same time.

CLINICAL DATA: Right foot and ankle pain after falling down stairs.

EXAM:
RIGHT FOOT COMPLETE - 3+ VIEW

[foot ap]
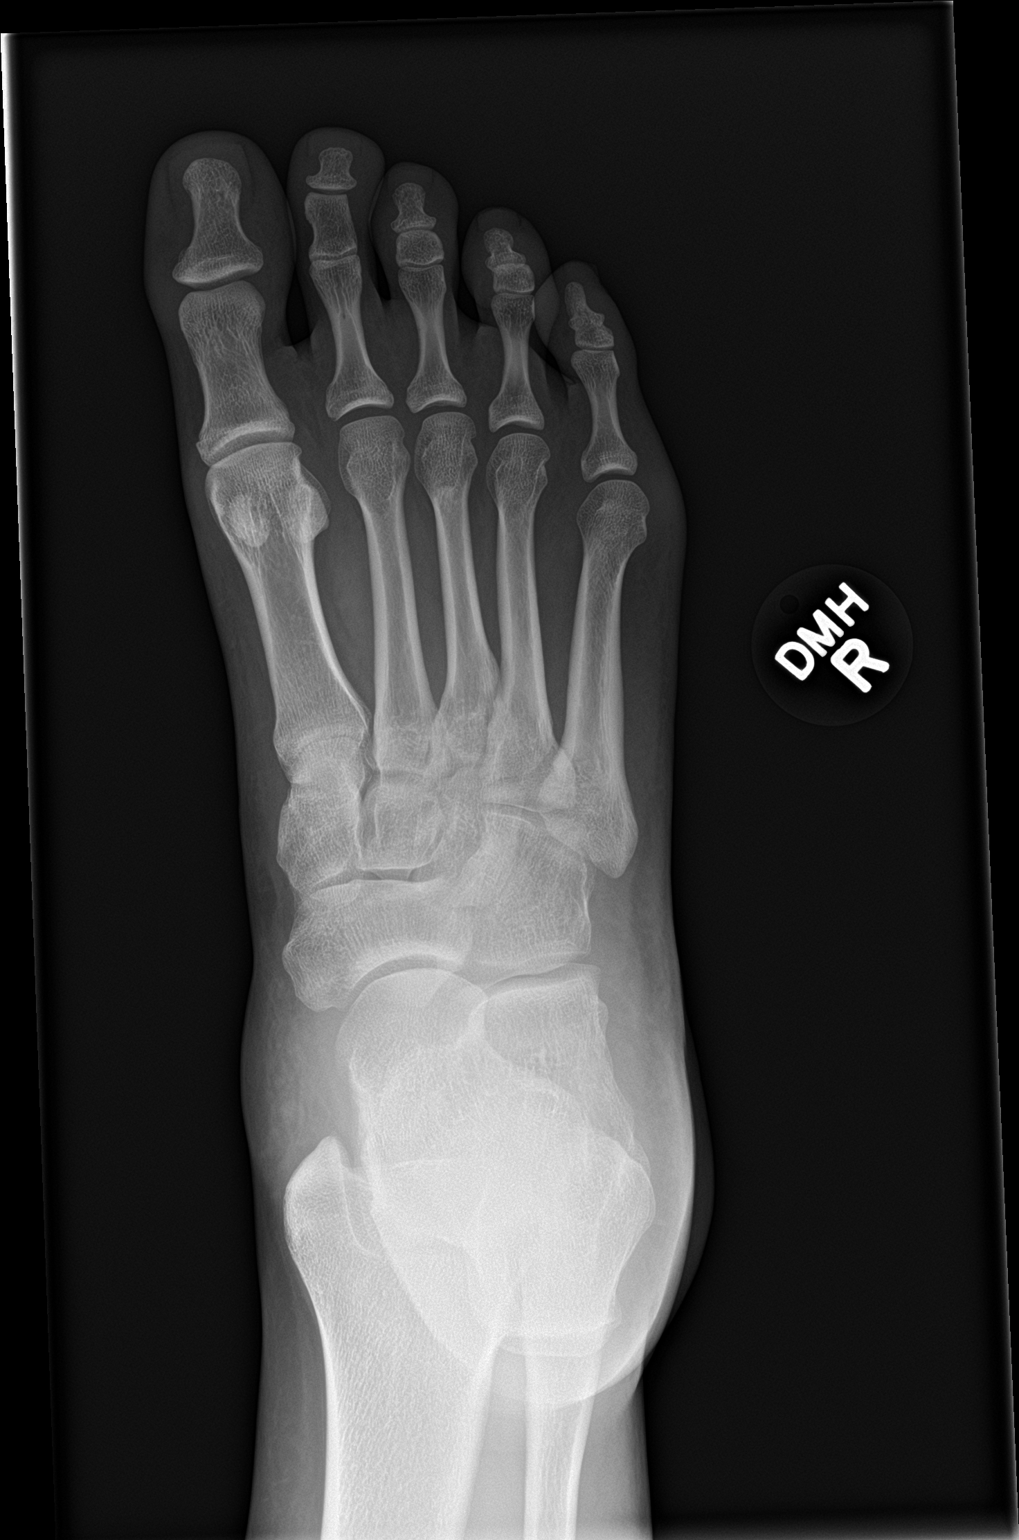

[foot obl]
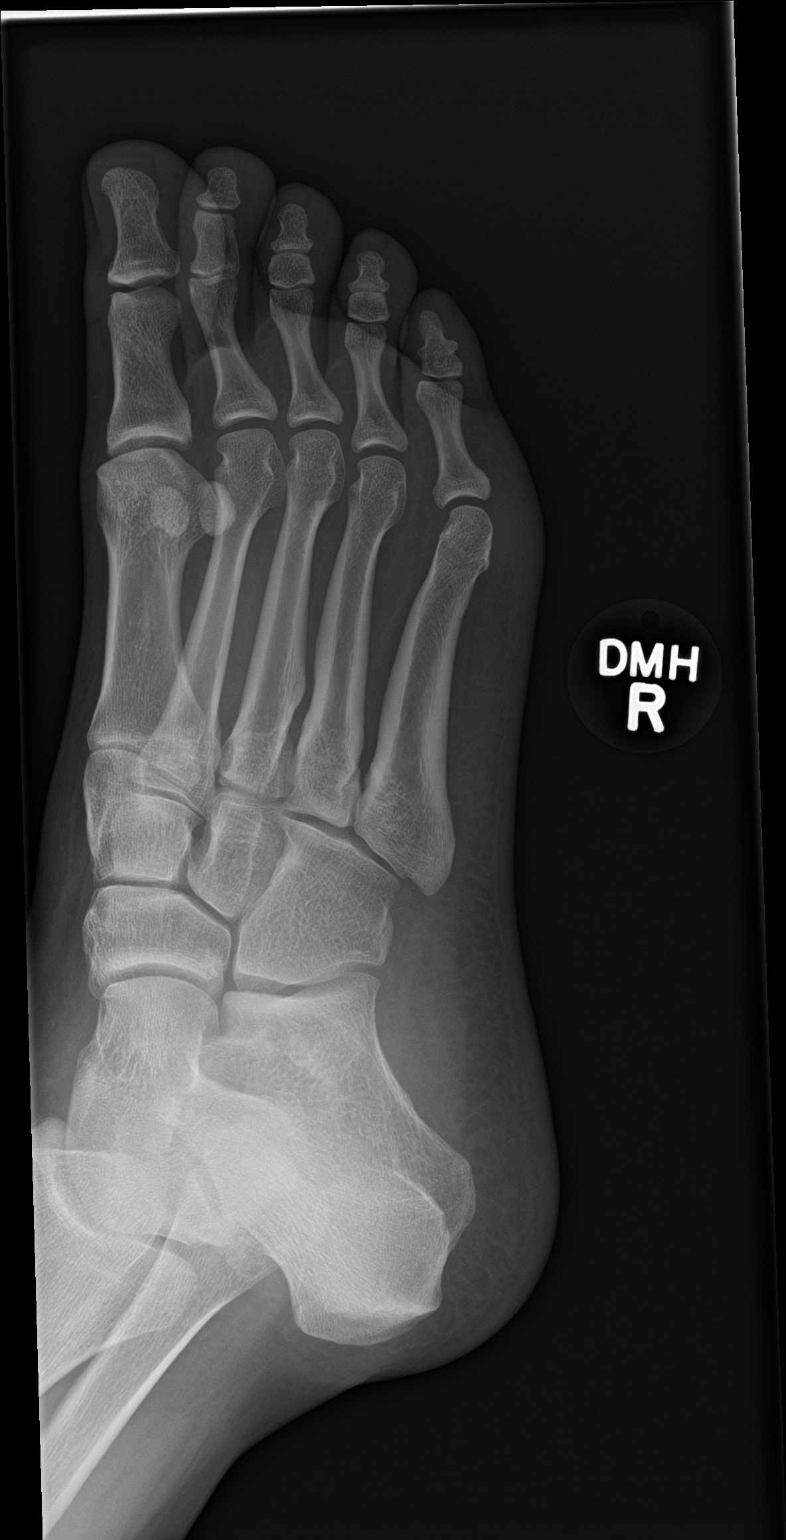

[foot lat]
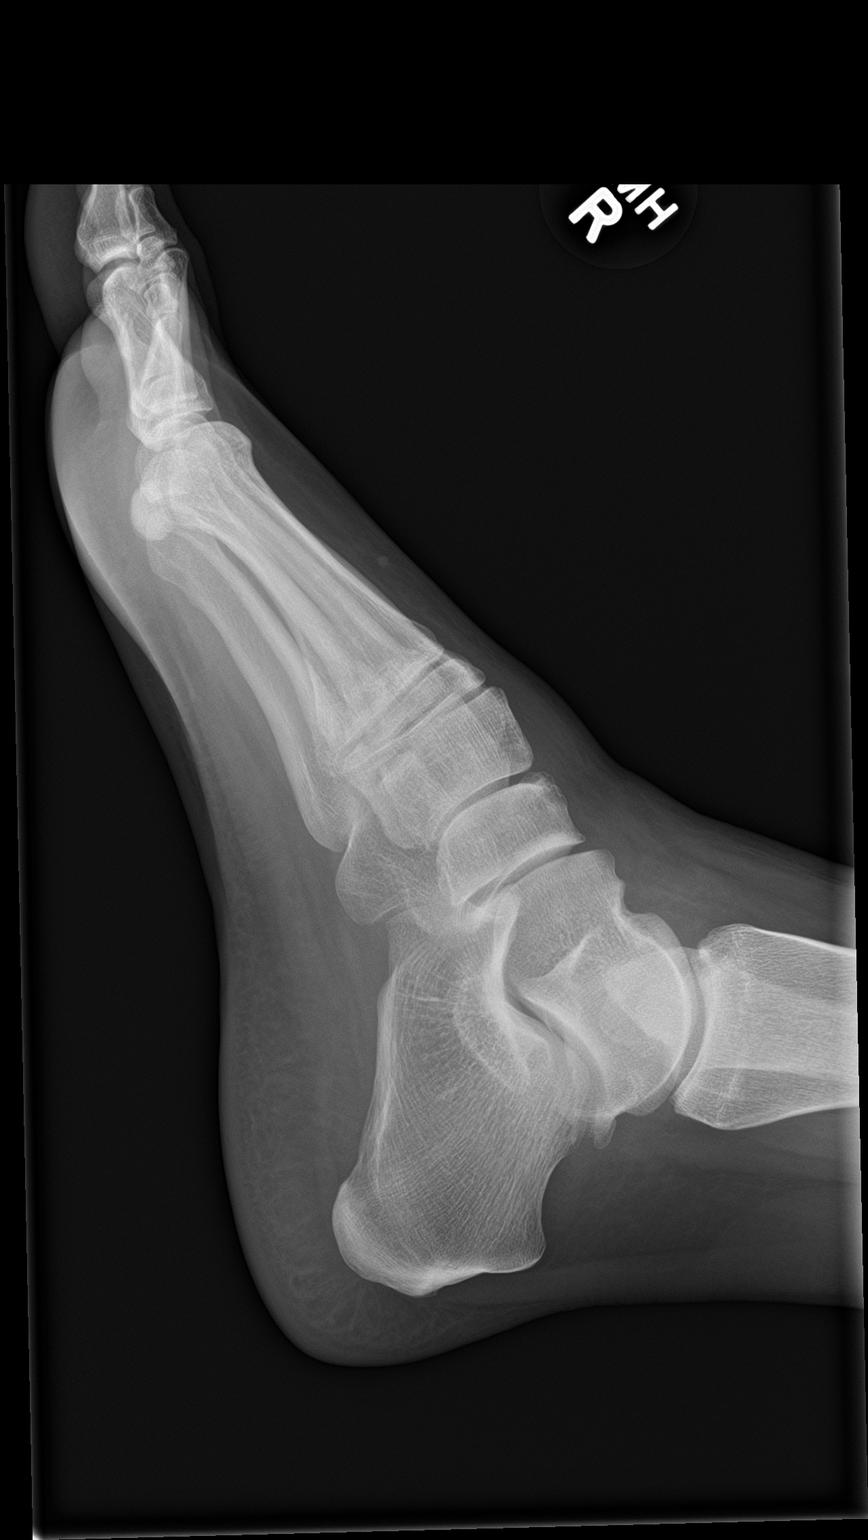

[3 of 3 positions shown; findings below may reference images not displayed]

FINDINGS: Proximal soft tissue swelling. No fracture or dislocation seen.
IMPRESSION: No fracture.

## 2020-07-07 ENCOUNTER — Inpatient Hospital Stay (HOSPITAL_COMMUNITY)
Admission: AD | Admit: 2020-07-07 | Discharge: 2020-07-07 | Disposition: A | Discharge status: Home / Self Care | Payer: Medicaid Other | Attending: Obstetrics and Gynecology | Admitting: Obstetrics and Gynecology

## 2020-07-07 ENCOUNTER — Encounter (HOSPITAL_COMMUNITY): Payer: Self-pay | Admitting: Emergency Medicine

## 2020-07-07 ENCOUNTER — Other Ambulatory Visit: Payer: Self-pay

## 2020-07-07 ENCOUNTER — Inpatient Hospital Stay (HOSPITAL_COMMUNITY): Payer: Medicaid Other

## 2020-07-07 DIAGNOSIS — O209 Hemorrhage in early pregnancy, unspecified: Secondary | ICD-10-CM | POA: Insufficient documentation

## 2020-07-07 DIAGNOSIS — Z3A01 Less than 8 weeks gestation of pregnancy: Secondary | ICD-10-CM | POA: Diagnosis not present

## 2020-07-07 DIAGNOSIS — Z3490 Encounter for supervision of normal pregnancy, unspecified, unspecified trimester: Secondary | ICD-10-CM

## 2020-07-07 DIAGNOSIS — O4691 Antepartum hemorrhage, unspecified, first trimester: Secondary | ICD-10-CM

## 2020-07-07 DIAGNOSIS — O4690 Antepartum hemorrhage, unspecified, unspecified trimester: Secondary | ICD-10-CM

## 2020-07-07 LAB — CBC
HCT: 39 % (ref 36.0–46.0)
Hemoglobin: 12.7 g/dL (ref 12.0–15.0)
MCH: 31.4 pg (ref 26.0–34.0)
MCHC: 32.6 g/dL (ref 30.0–36.0)
MCV: 96.5 fL (ref 80.0–100.0)
Platelets: 361 10*3/uL (ref 150–400)
RBC: 4.04 MIL/uL (ref 3.87–5.11)
RDW: 12.6 % (ref 11.5–15.5)
WBC: 10.9 10*3/uL — ABNORMAL HIGH (ref 4.0–10.5)
nRBC: 0 % (ref 0.0–0.2)

## 2020-07-07 LAB — COMPREHENSIVE METABOLIC PANEL
ALT: 16 U/L (ref 0–44)
AST: 19 U/L (ref 15–41)
Albumin: 4 g/dL (ref 3.5–5.0)
Alkaline Phosphatase: 53 U/L (ref 38–126)
Anion gap: 11 (ref 5–15)
BUN: 7 mg/dL (ref 6–20)
CO2: 23 mmol/L (ref 22–32)
Calcium: 9.1 mg/dL (ref 8.9–10.3)
Chloride: 100 mmol/L (ref 98–111)
Creatinine, Ser: 0.58 mg/dL (ref 0.44–1.00)
GFR, Estimated: >60 mL/min (ref >60)
Glucose, Bld: 86 mg/dL (ref 70–99)
Potassium: 3.7 mmol/L (ref 3.5–5.1)
Sodium: 134 mmol/L — ABNORMAL LOW (ref 135–145)
Total Bilirubin: 0.7 mg/dL (ref 0.3–1.2)
Total Protein: 7.4 g/dL (ref 6.5–8.1)

## 2020-07-07 LAB — URINALYSIS, ROUTINE W REFLEX MICROSCOPIC
Bilirubin Urine: NEGATIVE
Glucose, UA: NEGATIVE mg/dL
Hgb urine dipstick: NEGATIVE
Ketones, ur: 80 mg/dL — AB
Leukocytes,Ua: NEGATIVE
Nitrite: NEGATIVE
Protein, ur: NEGATIVE mg/dL
Specific Gravity, Urine: 1.028 (ref 1.005–1.030)
pH: 5 (ref 5.0–8.0)

## 2020-07-07 LAB — WET PREP, GENITAL
Sperm: NONE SEEN
Trich, Wet Prep: NONE SEEN
Yeast Wet Prep HPF POC: NONE SEEN

## 2020-07-07 LAB — I-STAT BETA HCG BLOOD, ED (MC, WL, AP ONLY): I-stat hCG, quantitative: >2000 m[IU]/mL — ABNORMAL HIGH (ref <5)

## 2020-07-07 LAB — HCG, QUANTITATIVE, PREGNANCY: hCG, Beta Chain, Quant, S: 69516 m[IU]/mL — ABNORMAL HIGH (ref <5)

## 2020-07-07 NOTE — [facility] Note (Signed)
.   Martha Johnson is a 29 y.o. at [redacted]w[redacted]d here in [facility] reporting: spotting that happened this morning. She states that there was a "little pink line" in her underwear and was unsure if it was normal. Has not been seen yet with this pregnancy. Does report white discharge "like a yeast infection" x3 days.  Pain score: 0 Vitals:   07/07/20 1214 07/07/20 1504  BP: 137/79 119/73  Pulse: (!) 107 67  Resp: 20 16  Temp: 98.4 F (36.9 C)   SpO2: 99% 100%      Lab orders placed from triage: [facility]

## 2020-07-07 NOTE — Discharge Instructions (Signed)
Safe Medications in Pregnancy    Acne: Benzoyl Peroxide Salicylic Acid  Backache/Headache: Tylenol: 2 regular strength every 4 hours OR              2 Extra strength every 6 hours  Colds/Coughs/Allergies: Benadryl (alcohol free) 25 mg every 6 hours as needed Breath right strips Claritin Cepacol throat lozenges Chloraseptic throat spray Cold-Eeze- up to three times per day Cough drops, alcohol free Flonase (by prescription only) Guaifenesin Mucinex Robitussin DM (plain only, alcohol free) Saline nasal spray/drops Sudafed (pseudoephedrine) & Actifed ** use only after [redacted] weeks gestation and if you do not have high blood pressure Tylenol Vicks Vaporub Zinc lozenges Zyrtec   Constipation: Colace Ducolax suppositories Fleet enema Glycerin suppositories Metamucil Milk of magnesia Miralax Senokot Smooth move tea  Diarrhea: Kaopectate Imodium A-D  *NO pepto Bismol  Hemorrhoids: Anusol Anusol HC Preparation H Tucks  Indigestion: Tums Maalox Mylanta Zantac  Pepcid  Insomnia: Benadryl (alcohol free) 25mg  every 6 hours as needed Tylenol PM Unisom, no Gelcaps  Leg Cramps: Tums MagGel  Nausea/Vomiting:  Bonine Dramamine Emetrol Ginger extract Sea bands Meclizine  Nausea medication to take during pregnancy:  Unisom (doxylamine succinate 25 mg tablets) Take one tablet daily at bedtime. If symptoms are not adequately controlled, the dose can be increased to a maximum recommended dose of two tablets daily (1/2 tablet in the morning, 1/2 tablet mid-afternoon and one at bedtime). Vitamin B6 100mg  tablets. Take one tablet twice a day (up to 200 mg per day).  Skin Rashes: Aveeno products Benadryl cream or 25mg  every 6 hours as needed Calamine Lotion 1% cortisone cream  Yeast infection: Gyne-lotrimin 7 Monistat 7   **If taking multiple medications, please check labels to avoid duplicating the same active ingredients **take  medication as directed on the label ** Do not exceed 4000 mg of tylenol in 24 hours **Do not take medications that contain aspirin or ibuprofen          Prenatal Care Providers           [facility] @ [facility] - accepts patients without insurance  Phone: [phone]  Center for @ [facility]   Phone: [phone]  [facility] @[place]       Phone: [phone]            [facility] @ [place]     Phone: [phone]          Center for [id number] @ [facility]   Phone: [phone]  [facility] @ [facility] - accepts patients without insurance  Phone: [phone]  [facility] for [facility] @ [facility] Phone: [phone]     [facility] Department - accepts patients without insurance Phone: [phone]  [facility]  Phone: [phone]  [facility] Phone: [phone]  [facility] Phone: [phone]  [facility] Phone: [phone]  [facility] Phone: [phone]  [facility]  Phone: [phone]         First Trimester of Pregnancy The first trimester of pregnancy is from week 1 until the end of week 13 (months 1 through 3). A week after a sperm fertilizes an egg, the egg will implant on the wall of the uterus. This embryo will begin to develop into a baby. Genes from you and your partner will form the baby. The female genes will determine whether the baby will be a boy or a girl. At 6-8  weeks, the eyes and face will be formed, and the heartbeat can be seen on ultrasound. At the end of 12 weeks, all the baby's organs will be formed. Now that you are pregnant, you will want to do everything you can to have a healthy baby. Two of the most important things are to get good prenatal care and to follow your health care provider's instructions. Prenatal care is all the  medical care you receive before the baby's birth. This care will help prevent, find, and treat any problems during the pregnancy and childbirth. Body changes during your first trimester Your body goes through many changes during pregnancy. The changes vary from woman to woman.  You may gain or lose a couple of pounds at first.  You may feel sick to your stomach (nauseous) and you may throw up (vomit). If the vomiting is uncontrollable, call your health care provider.  You may tire easily.  You may develop headaches that can be relieved by medicines. All medicines should be approved by your health care provider.  You may urinate more often. Painful urination may mean you have a bladder infection.  You may develop heartburn as a result of your pregnancy.  You may develop constipation because certain hormones are causing the muscles that push stool through your intestines to slow down.  You may develop hemorrhoids or swollen veins (varicose veins).  Your breasts may begin to grow larger and become tender. Your nipples may stick out more, and the tissue that surrounds them (areola) may become darker.  Your gums may bleed and may be sensitive to brushing and flossing.  Dark spots or blotches (chloasma, mask of pregnancy) may develop on your face. This will likely fade after the baby is born.  Your menstrual periods will stop.  You may have a loss of appetite.  You may develop cravings for certain kinds of food.  You may have changes in your emotions from day to day, such as being excited to be pregnant or being concerned that something may go wrong with the pregnancy and baby.  You may have more vivid and strange dreams.  You may have changes in your hair. These can include thickening of your hair, rapid growth, and changes in texture. Some women also have hair loss during or after pregnancy, or hair that feels dry or thin. Your hair will most likely return to normal after your baby is  born. What to expect at prenatal visits During a routine prenatal visit:  You will be weighed to make sure you and the baby are growing normally.  Your blood pressure will be taken.  Your abdomen will be measured to track your baby's growth.  The fetal heartbeat will be listened to between weeks 10 and 14 of your pregnancy.  Test results from any previous visits will be discussed. Your health care provider may ask you:  How you are feeling.  If you are feeling the baby move.  If you have had any abnormal symptoms, such as leaking fluid, bleeding, severe headaches, or abdominal cramping.  If you are using any tobacco products, including cigarettes, chewing tobacco, and electronic cigarettes.  If you have any questions. Other tests that may be performed during your first trimester include:  Blood tests to find your blood type and to check for the presence of any previous infections. The tests will also be used to check for low iron levels (anemia) and protein on red blood cells (Rh antibodies). Depending on your risk  factors, or if you previously had diabetes during pregnancy, you may have tests to check for high blood sugar that affects pregnant women (gestational diabetes).  Urine tests to check for infections, diabetes, or protein in the urine.  An ultrasound to confirm the proper growth and development of the baby.  Fetal screens for spinal cord problems (spina bifida) and Down syndrome.  HIV (human immunodeficiency virus) testing. Routine prenatal testing includes screening for HIV, unless you choose not to have this test.  You may need other tests to make sure you and the baby are doing well. Follow these instructions at home: Medicines  Follow your health care provider's instructions regarding medicine use. Specific medicines may be either safe or unsafe to take during pregnancy.  Take a prenatal vitamin that contains at least 600 micrograms (mcg) of folic acid.  If  you develop constipation, try taking a stool softener if your health care provider approves. Eating and drinking   Eat a balanced diet that includes fresh fruits and vegetables, whole grains, good sources of protein such as meat, eggs, or tofu, and low-fat dairy. Your health care provider will help you determine the amount of weight gain that is right for you.  Avoid raw meat and uncooked cheese. These carry germs that can cause birth defects in the baby.  Eating four or five small meals rather than three large meals a day may help relieve nausea and vomiting. If you start to feel nauseous, eating a few soda crackers can be helpful. Drinking liquids between meals, instead of during meals, also seems to help ease nausea and vomiting.  Limit foods that are high in fat and processed sugars, such as fried and sweet foods.  To prevent constipation: ? Eat foods that are high in fiber, such as fresh fruits and vegetables, whole grains, and beans. ? Drink enough fluid to keep your urine clear or pale yellow. Activity  Exercise only as directed by your health care provider. Most women can continue their usual exercise routine during pregnancy. Try to exercise for 30 minutes at least 5 days a week. Exercising will help you: ? Control your weight. ? Stay in shape. ? Be prepared for labor and delivery.  Experiencing pain or cramping in the lower abdomen or lower back is a good sign that you should stop exercising. Check with your health care provider before continuing with normal exercises.  Try to avoid standing for long periods of time. Move your legs often if you must stand in one place for a long time.  Avoid heavy lifting.  Wear low-heeled shoes and practice good posture.  You may continue to have sex unless your health care provider tells you not to. Relieving pain and discomfort  Wear a good support bra to relieve breast tenderness.  Take warm sitz baths to soothe any pain or discomfort  caused by hemorrhoids. Use hemorrhoid cream if your health care provider approves.  Rest with your legs elevated if you have leg cramps or low back pain.  If you develop varicose veins in your legs, wear support hose. Elevate your feet for 15 minutes, 3-4 times a day. Limit salt in your diet. Prenatal care  Schedule your prenatal visits by the twelfth week of pregnancy. They are usually scheduled monthly at first, then more often in the last 2 months before delivery.  Write down your questions. Take them to your prenatal visits.  Keep all your prenatal visits as told by your health care provider. This is  important. Safety  Wear your seat belt at all times when driving.  Make a list of emergency phone numbers, including numbers for family, friends, the hospital, and police and fire departments. General instructions  Ask your health care provider for a referral to a local prenatal education class. Begin classes no later than the beginning of month 6 of your pregnancy.  Ask for help if you have counseling or nutritional needs during pregnancy. Your health care provider can offer advice or refer you to specialists for help with various needs.  Do not use hot tubs, steam rooms, or saunas.  Do not douche or use tampons or scented sanitary pads.  Do not cross your legs for long periods of time.  Avoid cat litter boxes and soil used by cats. These carry germs that can cause birth defects in the baby and possibly loss of the fetus by miscarriage or stillbirth.  Avoid all smoking, herbs, alcohol, and medicines not prescribed by your health care provider. Chemicals in these products affect the formation and growth of the baby.  Do not use any products that contain nicotine or tobacco, such as cigarettes and e-cigarettes. If you need help quitting, ask your health care provider. You may receive counseling support and other resources to help you quit.  Schedule a dentist appointment. At home,  brush your teeth with a soft toothbrush and be gentle when you floss. Contact a health care provider if:  You have dizziness.  You have mild pelvic cramps, pelvic pressure, or nagging pain in the abdominal area.  You have persistent nausea, vomiting, or diarrhea.  You have a bad smelling vaginal discharge.  You have pain when you urinate.  You notice increased swelling in your face, hands, legs, or ankles.  You are exposed to fifth disease or chickenpox.  You are exposed to [place] measles (rubella) and have never had it. Get help right away if:  You have a fever.  You are leaking fluid from your vagina.  You have spotting or bleeding from your vagina.  You have severe abdominal cramping or pain.  You have rapid weight gain or loss.  You vomit blood or material that looks like coffee grounds.  You develop a severe headache.  You have shortness of breath.  You have any kind of trauma, such as from a fall or a car accident. Summary  The first trimester of pregnancy is from week 1 until the end of week 13 (months 1 through 3).  Your body goes through many changes during pregnancy. The changes vary from woman to woman.  You will have routine prenatal visits. During those visits, your health care provider will examine you, discuss any test results you may have, and talk with you about how you are feeling. This information is not intended to replace advice given to you by your health care provider. Make sure you discuss any questions you have with your health care provider. Document Revised: 06/16/2017 Document Reviewed: 06/15/2016 Elsevier Patient Education  2020 [employer].

## 2020-07-07 NOTE — ED Triage Notes (Addendum)
States is [redacted] weeks pregnant with spotting today-- light red-- has hx of miscarraige 2 years ago, started the same way--  Also having white vag d/c

## 2020-07-07 NOTE — MAU Provider Note (Signed)
History     CSN: [id number]  Arrival date and time: 07/07/20 1206   Event Date/Time   First Provider Initiated Contact with Patient 07/07/20 2029      Chief Complaint  Patient presents with  . Vaginal Bleeding   Ms. Martha Johnson is a 29 y.o. G3P1001 at 4675w2d who presents to MAU for vaginal bleeding which began this morning. Patient reports one episode of a small spot of pink on the toilet paper after wiping earlier this morning, without additional bleeding. Patient denies pain. Patient reports she had a miscarriage with her first pregnancy and was concerned that was why she was bleeding today.  Pt denies vaginal discharge/odor/itching. Pt denies N/V, abdominal pain, constipation, diarrhea, or urinary problems. Pt denies fever, chills, fatigue, sweating or changes in appetite. Pt denies SOB or chest pain. Pt denies dizziness, HA, light-headedness, weakness.   OB History    Gravida  3   Para  1   Term  1   Preterm      AB      Living  1     SAB      IAB      Ectopic      Multiple      Live Births  1           Past Medical History:  Diagnosis Date  . Medical history non-contributory     Past Surgical History:  Procedure Laterality Date  . LEEP    . NO PAST SURGERIES      History reviewed. No pertinent family history.  Social History   Tobacco Use  . Smoking status: Never Smoker  . Smokeless tobacco: Never Used  Substance Use Topics  . Alcohol use: No    Comment: occassional  . Drug use: No    Types: Marijuana    Allergies:  Allergies  Allergen Reactions  . Penicillins Other (See Comments)    Childhood reaction    Medications Prior to Admission  Medication Sig Dispense Refill Last Dose  . acetaminophen (TYLENOL) 500 MG tablet Take 1,000 mg by mouth every 6 (six) hours as needed.   Past Month at Unknown time  . Prenatal Vit-Fe Fumarate-FA (PRENATAL MULTIVITAMIN) TABS tablet Take 1 tablet by mouth daily at 12 noon.   07/07/2020 at  Unknown time  . ibuprofen (ADVIL,MOTRIN) 800 MG tablet Take 1 tablet (800 mg total) by mouth every 8 (eight) hours as needed. 21 tablet 0   . metroNIDAZOLE (FLAGYL) 500 MG tablet Take 1 tablet (500 mg total) by mouth 2 (two) times daily. 13 tablet 0     Review of Systems  Constitutional: Negative for chills, diaphoresis, fatigue and fever.  Eyes: Negative for visual disturbance.  Respiratory: Negative for shortness of breath.   Cardiovascular: Negative for chest pain.  Gastrointestinal: Negative for abdominal pain, constipation, diarrhea, nausea and vomiting.  Genitourinary: Positive for vaginal bleeding. Negative for dysuria, flank pain, frequency, pelvic pain, urgency and vaginal discharge.  Neurological: Negative for dizziness, weakness, light-headedness and headaches.   Physical Exam   Blood pressure 126/68, pulse 73, temperature 99 F (37.2 C), temperature source Oral, resp. rate 15, last menstrual period 05/17/2020, SpO2 100 %, unknown if currently breastfeeding.  Patient Vitals for the past 24 hrs:  BP Temp Temp src Pulse Resp SpO2  07/07/20 1858 126/68 99 F (37.2 C) Oral 73 15 --  07/07/20 1504 119/73 -- -- 67 16 100 %  07/07/20 1214 137/79 98.4 F (36.9 C) Oral (!) 107  20 99 %   Physical Exam Vitals and nursing note reviewed.  Constitutional:      General: She is not in acute distress.    Appearance: Normal appearance. She is not ill-appearing, toxic-appearing or diaphoretic.  HENT:     Head: Normocephalic and atraumatic.  Pulmonary:     Effort: Pulmonary effort is normal.  Neurological:     Mental Status: She is alert and oriented to person, place, and time.  Psychiatric:        Mood and Affect: Mood normal.        Behavior: Behavior normal.        Thought Content: Thought content normal.        Judgment: Judgment normal.    Results for orders placed or performed during the hospital encounter of 07/07/20 (from the past 24 hour(s))  I-Stat beta hCG blood, ED      Status: Abnormal   Collection Time: 07/07/20 12:36 PM  Result Value Ref Range   I-stat hCG, quantitative >2,000.0 (H) <5 mIU/mL   Comment 3          Urinalysis, Routine w reflex microscopic Urine, Clean Catch     Status: Abnormal   Collection Time: 07/07/20  7:04 PM  Result Value Ref Range   Color, Urine YELLOW YELLOW   APPearance HAZY (A) CLEAR   Specific Gravity, Urine 1.028 1.005 - 1.030   pH 5.0 5.0 - 8.0   Glucose, UA NEGATIVE NEGATIVE mg/dL   Hgb urine dipstick NEGATIVE NEGATIVE   Bilirubin Urine NEGATIVE NEGATIVE   Ketones, ur 80 (A) NEGATIVE mg/dL   Protein, ur NEGATIVE NEGATIVE mg/dL   Nitrite NEGATIVE NEGATIVE   Leukocytes,Ua NEGATIVE NEGATIVE  Wet prep, genital     Status: Abnormal   Collection Time: 07/07/20  7:40 PM   Specimen: Vaginal  Result Value Ref Range   Yeast Wet Prep HPF POC NONE SEEN NONE SEEN   Trich, Wet Prep NONE SEEN NONE SEEN   Clue Cells Wet Prep HPF POC PRESENT (A) NONE SEEN   WBC, Wet Prep HPF POC MODERATE (A) NONE SEEN   Sperm NONE SEEN   CBC     Status: Abnormal   Collection Time: 07/07/20  7:46 PM  Result Value Ref Range   WBC 10.9 (H) 4.0 - 10.5 K/uL   RBC 4.04 3.87 - 5.11 MIL/uL   Hemoglobin 12.7 12.0 - 15.0 g/dL   HCT 25.0 53.9 - 76.7 %   MCV 96.5 80.0 - 100.0 fL   MCH 31.4 26.0 - 34.0 pg   MCHC 32.6 30.0 - 36.0 g/dL   RDW 34.1 93.7 - 90.2 %   Platelets 361 150 - 400 K/uL   nRBC 0.0 0.0 - 0.2 %  Comprehensive metabolic panel     Status: Abnormal   Collection Time: 07/07/20  7:46 PM  Result Value Ref Range   Sodium 134 (L) 135 - 145 mmol/L   Potassium 3.7 3.5 - 5.1 mmol/L   Chloride 100 98 - 111 mmol/L   CO2 23 22 - 32 mmol/L   Glucose, Bld 86 70 - 99 mg/dL   BUN 7 6 - 20 mg/dL   Creatinine, Ser 4.09 0.44 - 1.00 mg/dL   Calcium 9.1 8.9 - 73.5 mg/dL   Total Protein 7.4 6.5 - 8.1 g/dL   Albumin 4.0 3.5 - 5.0 g/dL   AST 19 15 - 41 U/L   ALT 16 0 - 44 U/L   Alkaline Phosphatase 53 38 - 126  U/L   Total Bilirubin 0.7 0.3 - 1.2  mg/dL   GFR, Estimated >14 >43 mL/min   Anion gap 11 5 - 15   US OB Comp Less 14 Wks  Result Date: 07/07/2020 CLINICAL DATA:  Initial evaluation for acute vaginal bleeding. Early pregnancy. EXAM: OBSTETRIC <14 WK ULTRASOUND TECHNIQUE: Transabdominal ultrasound was performed for evaluation of the gestation as well as the maternal uterus and adnexal regions. COMPARISON:  None. FINDINGS: Intrauterine gestational sac: Single Yolk sac:  Present Embryo:  Present Cardiac Activity: Present Heart Rate: 158 bpm CRL: 11.8 mm   7 w 2 d                  [place] EDC: 02/21/2021 Subchorionic hemorrhage:  None visualized. Maternal uterus/adnexae: Ovaries within normal limits. Small corpus luteal cyst noted on the left. No adnexal mass or free fluid. IMPRESSION: 1. Single viable intrauterine pregnancy as above, estimated gestational age [redacted] weeks and 2 days by crown-rump length, with ultrasound EDC of 02/21/2021. No complication. 2. No other acute maternal uterine or adnexal abnormality. Electronically Signed   By: Rise Mu M.D.   On: 07/07/2020 20:16    MAU Course  Procedures  MDM -r/o ectopic -UA: hazy/80ketones -CBC: WNL -CMP: no abnormalities requiring treatment -[place]: single IUP, +yolksac, embryo, FHR 158, [redacted]w[redacted]d -hCG: pending at time of discharge -ABO: A Positive -WetPrep: +ClueCells (isolated finding not requiring treatment) -GC/CT collected -pt discharged to home in stable condition  Orders Placed This Encounter  Procedures  . Wet prep, genital    Standing Status:   Standing    Number of Occurrences:   1  . US OB Comp Less 14 Wks    Standing Status:   Standing    Number of Occurrences:   1    Order Specific Question:   Symptom/Reason for Exam    Answer:   Vaginal bleeding in pregnancy [[id number]]  . Urinalysis, Routine w reflex microscopic Urine, Clean Catch    Standing Status:   Standing    Number of Occurrences:   1  . CBC    Standing Status:   Standing    Number of Occurrences:   1   . Comprehensive metabolic panel    Standing Status:   Standing    Number of Occurrences:   1  . hCG, quantitative, pregnancy    Standing Status:   Standing    Number of Occurrences:   1  . Diet NPO time specified    Standing Status:   Standing    Number of Occurrences:   1  . Pelvic cart to bedside    Standing Status:   Standing    Number of Occurrences:   1  . I-Stat beta hCG blood, ED    Standing Status:   Standing    Number of Occurrences:   1  . Discharge patient    Order Specific Question:   Discharge disposition    Answer:   01-Home or Self Care [1]    Order Specific Question:   Discharge patient date    Answer:   07/07/2020   No orders of the defined types were placed in this encounter.   Assessment and Plan   1. Intrauterine pregnancy   2. Vaginal bleeding in pregnancy   3. [redacted] weeks gestation of pregnancy     Allergies as of 07/07/2020      Reactions   Penicillins Other (See Comments)   Childhood reaction      Medication  List    STOP taking these medications   ibuprofen 800 MG tablet Commonly known as: ADVIL     TAKE these medications   acetaminophen 500 MG tablet Commonly known as: TYLENOL Take 1,000 mg by mouth every 6 (six) hours as needed.   metroNIDAZOLE 500 MG tablet Commonly known as: FLAGYL Take 1 tablet (500 mg total) by mouth 2 (two) times daily.   prenatal multivitamin Tabs tablet Take 1 tablet by mouth daily at 12 noon.       -will call with culture results, if positive -safe meds in pregnancy list given -list of OB providers given, pt to start [facility] -return MAU precautions -pt discharged to home in stable condition  Joni Reining E Susa Bones 07/07/2020, 8:45 PM

## 2020-07-07 NOTE — ED Triage Notes (Signed)
Emergency Medicine Provider OB Triage Evaluation Note  Martha Johnson is a 29 y.o. female, G3P1011, at Unknown gestation who presents to the emergency department with complaints of vaginal bleeding that started this AM. Was very light and "pink" but this has since stopped. No current bleeding. Was having some mild pelvic cramping pain but this has since alleviated as well. Small amount of white vaginal discharge past 2-3 days. No vaginal itching. No dysuria or hematuria. Notes a history of prior miscarriage but states the bleeding was much more severe during prior miscarriage than the bleeding she experienced earlier today.  Patient believes she is about 2 to 3 months pregnant.  She has not been evaluated by OB/GYN.  Review of  Systems  Positive: Abdominal cramping, vaginal bleeding, vaginal discharge Negative: Hematuria, dysuria, vaginal itching  Physical Exam  BP 119/73 (BP Location: Left Arm)   Pulse 67   Temp 98.4 F (36.9 C) (Oral)   Resp 16   LMP 05/17/2020   SpO2 100%  General: Awake, no distress  HEENT: Atraumatic  Resp: Normal effort  Cardiac: Normal rate Abd: Nondistended, nontender  MSK: Moves all extremities without difficulty Neuro: Speech clear  Medical Decision Making  Pt evaluated for pregnancy concern and is stable for transfer to MAU. Pt is in agreement with plan for transfer.  5:32 PM Discussed with MAU APP, Joni Reining, who accepts patient in transfer.  Clinical Impression  No diagnosis found.     Placido Sou, PA-C 07/07/20 1741

## 2020-07-08 LAB — GC/CHLAMYDIA PROBE AMP (~~LOC~~) NOT AT ARMC
Chlamydia: NEGATIVE
Comment: NEGATIVE
Comment: NORMAL
Neisseria Gonorrhea: NEGATIVE

## 2020-07-18 NOTE — L&D Delivery Note (Signed)
OB/GYN Faculty Practice Delivery Note  Samadhi Mahurin is a 30 y.o. G3P1011 s/p NSVD at [redacted]w[redacted]d. She was admitted for SOL.   ROM: 5h 34m with thin meconium fluid GBS Status: negative Maximum Maternal Temperature: 98.4  Labor Progress: normal  Delivery Date/Time: 02/26/21 @2142  Delivery: Called to room and patient was complete and pushing. Head delivered )A. No nuchal cord present. Shoulder and body delivered in usual fashion. Infant with spontaneous cry, placed on mother's abdomen, dried and stimulated. Cord clamped x 2 after 1-minute delay, and cut by FOB. Cord blood drawn. Placenta delivered spontaneously with gentle cord traction. Fundus firm with massage and Pitocin. Labia, perineum, vagina, and cervix inspected inspected with 2nd degree laceration repaired with 2 sutures.   Placenta: intact Complications: none Lacerations: 2nd degree perineal EBL: 500  Postpartum Planning Desires pp BTL  Infant: female  APGARs 8/9   Pending weight  10/9, MD 02/26/2021 10:19 PM

## 2020-09-03 LAB — OB RESULTS CONSOLE GC/CHLAMYDIA
Chlamydia: NEGATIVE
Gonorrhea: NEGATIVE

## 2020-09-03 LAB — OB RESULTS CONSOLE ANTIBODY SCREEN: Antibody Screen: NEGATIVE

## 2020-09-03 LAB — OB RESULTS CONSOLE ABO/RH: RH Type: POSITIVE

## 2020-09-03 LAB — OB RESULTS CONSOLE RUBELLA ANTIBODY, IGM: Rubella: IMMUNE

## 2020-09-03 LAB — OB RESULTS CONSOLE HIV ANTIBODY (ROUTINE TESTING): HIV: NONREACTIVE

## 2020-09-03 LAB — OB RESULTS CONSOLE HEPATITIS B SURFACE ANTIGEN: Hepatitis B Surface Ag: NEGATIVE

## 2020-09-03 LAB — OB RESULTS CONSOLE RPR: RPR: NONREACTIVE

## 2020-12-04 DIAGNOSIS — O99322 Drug use complicating pregnancy, second trimester: Secondary | ICD-10-CM | POA: Diagnosis not present

## 2020-12-04 DIAGNOSIS — Z23 Encounter for immunization: Secondary | ICD-10-CM | POA: Diagnosis not present

## 2020-12-04 DIAGNOSIS — Z01411 Encounter for gynecological examination (general) (routine) with abnormal findings: Secondary | ICD-10-CM | POA: Diagnosis not present

## 2020-12-04 DIAGNOSIS — O99012 Anemia complicating pregnancy, second trimester: Secondary | ICD-10-CM | POA: Diagnosis not present

## 2020-12-04 DIAGNOSIS — N390 Urinary tract infection, site not specified: Secondary | ICD-10-CM | POA: Diagnosis not present

## 2020-12-04 DIAGNOSIS — K0889 Other specified disorders of teeth and supporting structures: Secondary | ICD-10-CM | POA: Diagnosis not present

## 2020-12-04 DIAGNOSIS — N760 Acute vaginitis: Secondary | ICD-10-CM | POA: Diagnosis not present

## 2020-12-04 DIAGNOSIS — Z91410 Personal history of adult physical and sexual abuse: Secondary | ICD-10-CM | POA: Diagnosis not present

## 2020-12-04 DIAGNOSIS — Z30431 Encounter for routine checking of intrauterine contraceptive device: Secondary | ICD-10-CM | POA: Diagnosis not present

## 2020-12-04 DIAGNOSIS — Z3483 Encounter for supervision of other normal pregnancy, third trimester: Secondary | ICD-10-CM | POA: Diagnosis not present

## 2020-12-04 DIAGNOSIS — O99312 Alcohol use complicating pregnancy, second trimester: Secondary | ICD-10-CM | POA: Diagnosis not present

## 2020-12-04 DIAGNOSIS — Z8742 Personal history of other diseases of the female genital tract: Secondary | ICD-10-CM | POA: Diagnosis not present

## 2020-12-04 DIAGNOSIS — Z113 Encounter for screening for infections with a predominantly sexual mode of transmission: Secondary | ICD-10-CM | POA: Diagnosis not present

## 2020-12-04 DIAGNOSIS — Z87891 Personal history of nicotine dependence: Secondary | ICD-10-CM | POA: Diagnosis not present

## 2020-12-04 DIAGNOSIS — Z8759 Personal history of other complications of pregnancy, childbirth and the puerperium: Secondary | ICD-10-CM | POA: Diagnosis not present

## 2020-12-15 ENCOUNTER — Encounter: Payer: Self-pay | Admitting: *Deleted

## 2021-01-25 DIAGNOSIS — O99012 Anemia complicating pregnancy, second trimester: Secondary | ICD-10-CM | POA: Diagnosis not present

## 2021-01-25 DIAGNOSIS — Z23 Encounter for immunization: Secondary | ICD-10-CM | POA: Diagnosis not present

## 2021-01-25 DIAGNOSIS — K0889 Other specified disorders of teeth and supporting structures: Secondary | ICD-10-CM | POA: Diagnosis not present

## 2021-01-25 DIAGNOSIS — Z8742 Personal history of other diseases of the female genital tract: Secondary | ICD-10-CM | POA: Diagnosis not present

## 2021-01-25 DIAGNOSIS — Z8759 Personal history of other complications of pregnancy, childbirth and the puerperium: Secondary | ICD-10-CM | POA: Diagnosis not present

## 2021-01-25 DIAGNOSIS — Z91410 Personal history of adult physical and sexual abuse: Secondary | ICD-10-CM | POA: Diagnosis not present

## 2021-01-25 DIAGNOSIS — A630 Anogenital (venereal) warts: Secondary | ICD-10-CM | POA: Diagnosis not present

## 2021-01-25 DIAGNOSIS — Z87891 Personal history of nicotine dependence: Secondary | ICD-10-CM | POA: Diagnosis not present

## 2021-01-25 DIAGNOSIS — E0590 Thyrotoxicosis, unspecified without thyrotoxic crisis or storm: Secondary | ICD-10-CM | POA: Diagnosis not present

## 2021-01-25 DIAGNOSIS — Z3483 Encounter for supervision of other normal pregnancy, third trimester: Secondary | ICD-10-CM | POA: Diagnosis not present

## 2021-01-28 LAB — OB RESULTS CONSOLE GBS: GBS: NEGATIVE

## 2021-02-22 ENCOUNTER — Telehealth (HOSPITAL_COMMUNITY): Payer: Self-pay | Admitting: *Deleted

## 2021-02-22 ENCOUNTER — Encounter (HOSPITAL_COMMUNITY): Payer: Self-pay | Admitting: *Deleted

## 2021-02-22 DIAGNOSIS — O365930 Maternal care for other known or suspected poor fetal growth, third trimester, not applicable or unspecified: Secondary | ICD-10-CM | POA: Diagnosis not present

## 2021-02-22 DIAGNOSIS — O480 Post-term pregnancy: Secondary | ICD-10-CM | POA: Diagnosis not present

## 2021-02-22 DIAGNOSIS — Z3483 Encounter for supervision of other normal pregnancy, third trimester: Secondary | ICD-10-CM | POA: Diagnosis not present

## 2021-02-22 NOTE — Telephone Encounter (Signed)
Preadmission screen  

## 2021-02-23 DIAGNOSIS — Z3483 Encounter for supervision of other normal pregnancy, third trimester: Secondary | ICD-10-CM | POA: Diagnosis not present

## 2021-02-25 ENCOUNTER — Inpatient Hospital Stay (HOSPITAL_COMMUNITY)
Admission: AD | Admit: 2021-02-25 | Discharge: 2021-02-26 | Disposition: A | Discharge status: Home / Self Care | Payer: Medicaid Other | Source: Home / Self Care | Attending: Obstetrics & Gynecology | Admitting: Obstetrics & Gynecology

## 2021-02-25 ENCOUNTER — Encounter (HOSPITAL_COMMUNITY): Payer: Self-pay | Admitting: Obstetrics & Gynecology

## 2021-02-25 DIAGNOSIS — Z3A40 40 weeks gestation of pregnancy: Secondary | ICD-10-CM | POA: Insufficient documentation

## 2021-02-25 DIAGNOSIS — O471 False labor at or after 37 completed weeks of gestation: Secondary | ICD-10-CM | POA: Insufficient documentation

## 2021-02-25 DIAGNOSIS — O479 False labor, unspecified: Secondary | ICD-10-CM

## 2021-02-25 NOTE — MAU Note (Signed)
Ctxs all day but tonight have gotten to apart and stronger. Was 2cm last sve. Some bloody show[facility] GBS neg. For IOL Sat

## 2021-02-26 ENCOUNTER — Inpatient Hospital Stay (HOSPITAL_COMMUNITY)
Admission: AD | Admit: 2021-02-26 | Discharge: 2021-02-28 | DRG: 797 | Disposition: A | Discharge status: Home / Self Care | Payer: Medicaid Other | Attending: Family Medicine | Admitting: Family Medicine

## 2021-02-26 ENCOUNTER — Encounter (HOSPITAL_COMMUNITY): Payer: Self-pay | Admitting: Obstetrics & Gynecology

## 2021-02-26 ENCOUNTER — Other Ambulatory Visit: Payer: Self-pay

## 2021-02-26 ENCOUNTER — Inpatient Hospital Stay (HOSPITAL_COMMUNITY): Payer: Medicaid Other | Admitting: Anesthesiology

## 2021-02-26 DIAGNOSIS — O99324 Drug use complicating childbirth: Secondary | ICD-10-CM | POA: Diagnosis present

## 2021-02-26 DIAGNOSIS — Z302 Encounter for sterilization: Secondary | ICD-10-CM | POA: Diagnosis not present

## 2021-02-26 DIAGNOSIS — O480 Post-term pregnancy: Secondary | ICD-10-CM | POA: Diagnosis not present

## 2021-02-26 DIAGNOSIS — Z20822 Contact with and (suspected) exposure to covid-19: Secondary | ICD-10-CM | POA: Diagnosis not present

## 2021-02-26 DIAGNOSIS — F1290 Cannabis use, unspecified, uncomplicated: Secondary | ICD-10-CM | POA: Diagnosis present

## 2021-02-26 DIAGNOSIS — Z3A40 40 weeks gestation of pregnancy: Secondary | ICD-10-CM | POA: Diagnosis not present

## 2021-02-26 HISTORY — DX: Urinary tract infection, site not specified: N39.0

## 2021-02-26 LAB — TYPE AND SCREEN
ABO/RH(D): A POS
Antibody Screen: NEGATIVE

## 2021-02-26 LAB — CBC
HCT: 34.1 % — ABNORMAL LOW (ref 36.0–46.0)
Hemoglobin: 11.1 g/dL — ABNORMAL LOW (ref 12.0–15.0)
MCH: 30.7 pg (ref 26.0–34.0)
MCHC: 32.6 g/dL (ref 30.0–36.0)
MCV: 94.2 fL (ref 80.0–100.0)
Platelets: 289 10*3/uL (ref 150–400)
RBC: 3.62 MIL/uL — ABNORMAL LOW (ref 3.87–5.11)
RDW: 15.1 % (ref 11.5–15.5)
WBC: 9.1 10*3/uL (ref 4.0–10.5)
nRBC: 0 % (ref 0.0–0.2)

## 2021-02-26 LAB — RAPID URINE DRUG SCREEN, HOSP PERFORMED
Amphetamines: NOT DETECTED
Barbiturates: NOT DETECTED
Benzodiazepines: NOT DETECTED
Cocaine: NOT DETECTED
Opiates: NOT DETECTED
Tetrahydrocannabinol: NOT DETECTED

## 2021-02-26 LAB — RESP PANEL BY RT-PCR (FLU A&B, COVID) ARPGX2
Influenza A by PCR: NEGATIVE
Influenza B by PCR: NEGATIVE
SARS Coronavirus 2 by RT PCR: NEGATIVE

## 2021-02-26 MED ORDER — LIDOCAINE HCL (PF) 1 % IJ SOLN: 30.0000 mL | INTRAMUSCULAR | Status: DC | PRN

## 2021-02-26 MED ORDER — LACTATED RINGERS IV SOLN: 500.0000 mL | INTRAVENOUS | Status: DC | PRN

## 2021-02-26 MED ORDER — IBUPROFEN 600 MG PO TABS
600.0000 mg | ORAL_TABLET | Freq: Four times a day (QID) | ORAL | Status: DC
  Administered 2021-02-27 – 2021-02-28 (×7): 600 mg via ORAL
  Filled 2021-02-26 (×7): qty 1

## 2021-02-26 MED ORDER — LACTATED RINGERS IV SOLN: 500.0000 mL | Freq: Once | INTRAVENOUS | Status: DC

## 2021-02-26 MED ORDER — OXYTOCIN-SODIUM CHLORIDE 30-0.9 UT/500ML-% IV SOLN
2.5000 [IU]/h | INTRAVENOUS | Status: DC
  Filled 2021-02-26: qty 500

## 2021-02-26 MED ORDER — PHENYLEPHRINE 40 MCG/ML (10ML) SYRINGE FOR IV PUSH (FOR BLOOD PRESSURE SUPPORT): 80.0000 ug | PREFILLED_SYRINGE | INTRAVENOUS | Status: DC | PRN

## 2021-02-26 MED ORDER — ZOLPIDEM TARTRATE 5 MG PO TABS: 5.0000 mg | ORAL_TABLET | Freq: Every evening | ORAL | Status: DC | PRN

## 2021-02-26 MED ORDER — OXYTOCIN-SODIUM CHLORIDE 30-0.9 UT/500ML-% IV SOLN: 1.0000 m[IU]/min | INTRAVENOUS | Status: DC

## 2021-02-26 MED ORDER — CALCIUM CARBONATE ANTACID 500 MG PO CHEW
1.0000 | CHEWABLE_TABLET | Freq: Once | ORAL | Status: AC
  Administered 2021-02-26: 200 mg via ORAL
  Filled 2021-02-26: qty 1

## 2021-02-26 MED ORDER — FLEET ENEMA 7-19 GM/118ML RE ENEM: 1.0000 | ENEMA | Freq: Every day | RECTAL | Status: DC | PRN

## 2021-02-26 MED ORDER — SOD CITRATE-CITRIC ACID 500-334 MG/5ML PO SOLN: 30.0000 mL | ORAL | Status: DC | PRN

## 2021-02-26 MED ORDER — OXYTOCIN BOLUS FROM INFUSION
333.0000 mL | Freq: Once | INTRAVENOUS | Status: AC
  Administered 2021-02-26: 333 mL via INTRAVENOUS

## 2021-02-26 MED ORDER — ACETAMINOPHEN 325 MG PO TABS
650.0000 mg | ORAL_TABLET | ORAL | Status: DC | PRN
  Administered 2021-02-26: 650 mg via ORAL
  Filled 2021-02-26: qty 2

## 2021-02-26 MED ORDER — OXYCODONE-ACETAMINOPHEN 5-325 MG PO TABS: 2.0000 | ORAL_TABLET | ORAL | Status: DC | PRN

## 2021-02-26 MED ORDER — METHYLERGONOVINE MALEATE 0.2 MG/ML IJ SOLN
INTRAMUSCULAR | Status: AC
  Administered 2021-02-26: 0.2 mg
  Filled 2021-02-26: qty 1

## 2021-02-26 MED ORDER — DIPHENHYDRAMINE HCL 50 MG/ML IJ SOLN
12.5000 mg | INTRAMUSCULAR | Status: DC | PRN
Start: 2021-02-26 — End: 2021-02-27

## 2021-02-26 MED ORDER — PHENYLEPHRINE 40 MCG/ML (10ML) SYRINGE FOR IV PUSH (FOR BLOOD PRESSURE SUPPORT)
80.0000 ug | PREFILLED_SYRINGE | INTRAVENOUS | Status: DC | PRN
  Filled 2021-02-26: qty 10

## 2021-02-26 MED ORDER — HYDROXYZINE HCL 50 MG PO TABS: 50.0000 mg | ORAL_TABLET | Freq: Four times a day (QID) | ORAL | Status: DC | PRN

## 2021-02-26 MED ORDER — EPHEDRINE 5 MG/ML INJ: 10.0000 mg | INTRAVENOUS | Status: DC | PRN

## 2021-02-26 MED ORDER — TERBUTALINE SULFATE 1 MG/ML IJ SOLN: 0.2500 mg | Freq: Once | INTRAMUSCULAR | Status: DC | PRN

## 2021-02-26 MED ORDER — LIDOCAINE HCL (PF) 1 % IJ SOLN
INTRAMUSCULAR | Status: DC | PRN
  Administered 2021-02-26: 3 mL via EPIDURAL
  Administered 2021-02-26: 5 mL via EPIDURAL
  Administered 2021-02-26: 2 mL via EPIDURAL

## 2021-02-26 MED ORDER — OXYCODONE-ACETAMINOPHEN 5-325 MG PO TABS: 1.0000 | ORAL_TABLET | ORAL | Status: DC | PRN

## 2021-02-26 MED ORDER — LACTATED RINGERS IV SOLN: INTRAVENOUS | Status: DC

## 2021-02-26 MED ORDER — LACTATED RINGERS IV SOLN
500.0000 mL | Freq: Once | INTRAVENOUS | Status: AC
  Administered 2021-02-26: 500 mL via INTRAVENOUS

## 2021-02-26 MED ORDER — FENTANYL CITRATE (PF) 100 MCG/2ML IJ SOLN
50.0000 ug | INTRAMUSCULAR | Status: DC | PRN
  Administered 2021-02-26: 100 ug via INTRAVENOUS
  Filled 2021-02-26: qty 2

## 2021-02-26 MED ORDER — FENTANYL-BUPIVACAINE-NACL 0.5-0.125-0.9 MG/250ML-% EP SOLN
12.0000 mL/h | EPIDURAL | Status: DC | PRN
  Administered 2021-02-26: 12 mL/h via EPIDURAL
  Filled 2021-02-26: qty 250

## 2021-02-26 MED ORDER — ONDANSETRON HCL 4 MG/2ML IJ SOLN: 4.0000 mg | Freq: Four times a day (QID) | INTRAMUSCULAR | Status: DC | PRN

## 2021-02-26 MED ORDER — ONDANSETRON HCL 4 MG/2ML IJ SOLN
4.0000 mg | Freq: Four times a day (QID) | INTRAMUSCULAR | Status: DC | PRN
  Administered 2021-02-26: 4 mg via INTRAVENOUS
  Filled 2021-02-26: qty 2

## 2021-02-26 NOTE — Anesthesia Procedure Notes (Signed)
Epidural Patient location during procedure: OB Start time: 02/26/2021 3:55 PM End time: 02/26/2021 4:00 PM  Staffing Anesthesiologist: Marcene Duos, MD Performed: anesthesiologist   Preanesthetic Checklist Completed: patient identified, IV checked, site marked, risks and benefits discussed, surgical consent, monitors and equipment checked, pre-op evaluation and timeout performed  Epidural Patient position: sitting Prep: DuraPrep and site prepped and draped Patient monitoring: continuous pulse ox and blood pressure Approach: midline Location: L4-L5 Injection technique: LOR air  Needle:  Needle type: Tuohy  Needle gauge: 17 G Needle length: 9 cm and 9 Needle insertion depth: 5 cm cm Catheter type: closed end flexible Catheter size: 19 Gauge Catheter at skin depth: 10 cm Test dose: negative  Assessment Events: blood not aspirated, injection not painful, no injection resistance, no paresthesia and negative IV test

## 2021-02-26 NOTE — MAU Note (Signed)
"  Just can't take it no more, tried to stay home but she just can't.  I want to get her out."Is for induction tomorrow, doesn't understand why it can't be done today. (Pt in tears)" Was here during the night, was 1 cm.  Ctxs are closer (3-5) and stronger.  No bleeding, no leaking.

## 2021-02-26 NOTE — H&P (Signed)
Obstetric History and Physical  Abree Snowberger is a 30 y.o. G3P1011 with IUP at [redacted]w[redacted]d presenting for contractions. Patient states she has been having  regular, every 3-5 minute contractions, no vaginal bleeding, intact membranes, with active fetal movement.  Patient was scheduled for IOL for post term on 02/27/21, but given her increased pain she desires to start her IOL today.   Prenatal Course Source of Care: [facility] Pregnancy complications or risks: Patient Active Problem List   Diagnosis Date Noted   Indication for care in labor or delivery 02/26/2021   Marijuana use 02/26/2021   She plans to breastfeed She desires bilateral tubal ligation for postpartum contraception, consent signed 01/04/2021.   Prenatal labs and studies: ABO, Rh: A/Positive/-- (02/17 0000) Antibody: Negative (02/17 0000) Rubella: Immune (02/17 0000) RPR: Nonreactive (02/17 0000)  HBsAg: Negative (02/17 0000)  HIV: Non-reactive (02/17 0000)  GBS: Negative 01/25/21 1 hr Glucola  112 Genetic screening normal Anatomy US normal  Prenatal Transfer Tool  Maternal Diabetes: No Genetic Screening: Normal Maternal Ultrasounds/Referrals: Normal Fetal Ultrasounds or other Referrals:  None Maternal Substance Abuse:  Yes:  Type: Marijuana Significant Maternal Medications:  None Significant Maternal Lab Results: Group B Strep negative  Past Medical History:  Diagnosis Date   Hyperthyroidism    UTI (urinary tract infection)    Vaginal Pap smear, abnormal     Past Surgical History:  Procedure Laterality Date   DILATION AND CURETTAGE OF UTERUS     LEEP      OB History  Gravida Para Term Preterm AB Living  3 1 1   1 1   SAB IAB Ectopic Multiple Live Births  1       1    # Outcome Date GA Lbr Len/2nd Weight Sex Delivery Anes PTL Lv  3 Current           2 Term 2009     Vag-Spont   LIV  1 SAB             Social History   Socioeconomic History   Marital status: Single    Spouse name: Not on file   Number  of children: Not on file   Years of education: Not on file   Highest education level: Not on file  Occupational History   Not on file  Tobacco Use   Smoking status: Never   Smokeless tobacco: Never  Vaping Use   Vaping Use: Never used  Substance and Sexual Activity   Alcohol use: No    Comment: occassional   Drug use: No    Types: Marijuana    Comment: last used 12/17/2020   Sexual activity: Yes  Other Topics Concern   Not on file  Social History Narrative   Not on file   Social Determinants of Health   Financial Resource Strain: Not on file  Food Insecurity: Not on file  Transportation Needs: Not on file  Physical Activity: Not on file  Stress: Not on file  Social Connections: Not on file    Family History  Problem Relation Age of Onset   Healthy Mother    Hypertension Father    Kidney disease Father     Medications Prior to Admission  Medication Sig Dispense Refill Last Dose   acetaminophen (TYLENOL) 500 MG tablet Take 1,000 mg by mouth every 6 (six) hours as needed.   02/26/2021 at 0500   Prenatal Vit-Fe Fumarate-FA (PRENATAL MULTIVITAMIN) TABS tablet Take 1 tablet by mouth daily at 12 noon.  02/25/2021   metroNIDAZOLE (FLAGYL) 500 MG tablet Take 1 tablet (500 mg total) by mouth 2 (two) times daily. 13 tablet 0     Allergies  Allergen Reactions   Penicillins Other (See Comments)    Childhood reaction    Review of Systems: Negative except for what is mentioned in HPI.  Physical Exam: BP 132/83   Pulse 87   Temp 97.9 F (36.6 C) (Oral)   Resp 20   Ht 5\' 9"  (1.753 m)   Wt 108.8 kg   LMP 05/17/2020   SpO2 98%   BMI 35.42 kg/m  CONSTITUTIONAL: Well-developed, well-nourished female in no acute distress.  HENT:  Normocephalic, atraumatic, External right and left ear normal. Oropharynx is clear and moist EYES: Conjunctivae and EOM are normal. Pupils are equal, round, and reactive to light. No scleral icterus.  NECK: Normal range of motion, supple, no  masses SKIN: Skin is warm and dry. No rash noted. Not diaphoretic. No erythema. No pallor. NEUROLOGIC: Alert and oriented to person, place, and time. Normal reflexes, muscle tone coordination. No cranial nerve deficit noted. PSYCHIATRIC: Normal mood and affect. Normal behavior. Normal judgment and thought content. CARDIOVASCULAR: Normal heart rate noted, regular rhythm RESPIRATORY: Effort and breath sounds normal, no problems with respiration noted ABDOMEN: Soft, nontender, nondistended, gravid. MUSCULOSKELETAL: Normal range of motion. No edema and no tenderness. 2+ distal pulses.  Cervical Exam: Dilatation 2cm   Effacement 90%   Station  -3 (by RN)   Presentation: Cephalic FHT:  Baseline rate 130 bpm   Variability moderate  Accelerations present   Decelerations none Contractions: Every 4-7 mins   Pertinent Labs/Studies:   No results found for this or any previous visit (from the past 24 hour(s)).  Assessment : Susann Haugen is a 30 y.o. G3P1011 at [redacted]w[redacted]d being admitted for early labor, desires initiation of induction of labor due to postterm.  Plan: Labor: Induction/Augmentation as ordered as per protocol. Analgesia as needed. FWB: Reassuring fetal heart tracing.  GBS negative Delivery plan: Hopeful for vaginal delivery. Desires PP BTS, papers signed 01/04/21.   01/06/21, MD, FACOG Obstetrician & Gynecologist, [facility] for [facility]

## 2021-02-26 NOTE — Lactation Note (Signed)
This note was copied from a baby's chart. Lactation Consultation Note Mom had baby on the breast when LC entered room. Baby pops off and on. LC adjusted position and latch. Mom has short shaft compressible nipples. Hand expression demonstrated colostrum. Intermittent swallows heard. Praised mom.  Mom has a 32 yr old that she didn't BF. Will f/u w/mom and baby on MBU.  Patient Name: Martha Johnson Date: 02/26/2021 Reason for consult: L&D Initial assessment;Term;1st time breastfeeding Age:73 hours  Maternal Data Has patient been taught Hand Expression?: Yes Does the patient have breastfeeding experience prior to this delivery?: No  Feeding    LATCH Score Latch: Grasps breast easily, tongue down, lips flanged, rhythmical sucking.  Audible Swallowing: A few with stimulation  Type of Nipple: Everted at rest and after stimulation (short shaft)  Comfort (Breast/Nipple): Soft / non-tender  Hold (Positioning): Assistance needed to correctly position infant at breast and maintain latch.  LATCH Score: 8   Lactation Tools Discussed/Used    Interventions Interventions: Breast feeding basics reviewed;Adjust position;Assisted with latch;Support pillows;Skin to skin;Position options;Breast massage;Hand express;Breast compression  Discharge    Consult Status Consult Status: Follow-up Date: 02/27/21 Follow-up type: In-patient    Charyl Dancer 02/26/2021, 10:37 PM

## 2021-02-26 NOTE — Anesthesia Preprocedure Evaluation (Addendum)
Anesthesia Evaluation  Patient identified by MRN, date of birth, ID band Patient awake    Reviewed: Allergy & Precautions, NPO status , Patient's Chart, lab work & pertinent test results  Airway Mallampati: II  TM Distance: >3 FB     Dental   Pulmonary neg pulmonary ROS,    Pulmonary exam normal        Cardiovascular negative cardio ROS Normal cardiovascular exam     Neuro/Psych negative neurological ROS     GI/Hepatic negative GI ROS, Neg liver ROS,   Endo/Other  negative endocrine ROS  Renal/GU negative Renal ROS     Musculoskeletal   Abdominal   Peds  Hematology negative hematology ROS (+)   Anesthesia Other Findings   Reproductive/Obstetrics (+) Pregnancy                            Lab Results  Component Value Date   WBC 9.1 02/26/2021   HGB 11.1 (L) 02/26/2021   HCT 34.1 (L) 02/26/2021   MCV 94.2 02/26/2021   PLT 289 02/26/2021    Anesthesia Physical Anesthesia Plan  ASA: 2  Anesthesia Plan: Epidural   Post-op Pain Management:    Induction:   PONV Risk Score and Plan: Treatment may vary due to age or medical condition  Airway Management Planned: Natural Airway  Additional Equipment:   Intra-op Plan:   Post-operative Plan:   Informed Consent: I have reviewed the patients History and Physical, chart, labs and discussed the procedure including the risks, benefits and alternatives for the proposed anesthesia with the patient or authorized representative who has indicated his/her understanding and acceptance.       Plan Discussed with:   Anesthesia Plan Comments:         Anesthesia Quick Evaluation

## 2021-02-26 NOTE — Progress Notes (Addendum)
Labor Progress Note Martha Johnson is a 30 y.o. G3P1011 at [redacted]w[redacted]d presented for IOL-PD.  S: Patient is resting comfortably after epidural.  O:  BP 133/78 (BP Location: Right Arm)   Pulse 87   Temp 98.1 F (36.7 C) (Oral)   Resp 18   Ht 5\' 9"  (1.753 m)   Wt 108.8 kg   LMP 05/17/2020   SpO2 100%   BMI 35.42 kg/m   EFM: Baseline 140 BPM/+accels/-decels/mod variability Toco: q2-42min contractions   CVE: Dilation: 2.5 Effacement (%): 100 Cervical Position: Posterior Station: -2 Presentation: Vertex Exam by:: Dr. [email or web address]   A&P: 30 y.o. 34 [redacted]w[redacted]d IOL-PD  #Labor: Discussed options to induce labor, pt opted for AROM. AROM@1645 -thin mec. On recheck, likely start pitocin if unchanged.  #Pain: PRN #FWB: cat 1 #GBS negative #THC use in pregnancy: UDS neg  #previous LGA infant   [redacted]w[redacted]d, MD [name] 5:14 PM   GME ATTESTATION:  I saw and evaluated the patient. I agree with the findings and the plan of care as documented in the resident's note.  [employer], MD OB Fellow, Faculty [facility], Center for [facility] 02/26/2021 5:49 PM

## 2021-02-26 NOTE — MAU Note (Signed)
S: Ms. Martha Johnson is a 30 y.o. G3P1011 at [redacted]w[redacted]d  who presents to MAU today for labor evaluation.     Cervical exam by RN:  Dilation: 1 Effacement (%): 80 Cervical Position: Posterior Station: -2 Presentation: Vertex Exam by:: weston,rn  Fetal Monitoring: Baseline: 135 bpm Variability: moderate variability Accelerations: 15x15 accels Decelerations: one early decel noted 0112 Contractions: q2-3  MDM Discussed patient with RN. NST reviewed. Patient had repeat NST after early decel that was reactive and reassuring. Patient is stable for discharge  A: SIUP at [redacted]w[redacted]d  False labor  P: Discharge home Labor precautions and kick counts included in AVS Patient to follow-up with prenatal provider as scheduled. Also scheduled for induction 8/14 Patient may return to MAU as needed or when in labor   Nelson Chimes, MD 02/26/2021 2:33 AM

## 2021-02-27 ENCOUNTER — Encounter (HOSPITAL_COMMUNITY): Payer: Self-pay | Admitting: Family Medicine

## 2021-02-27 ENCOUNTER — Encounter (HOSPITAL_COMMUNITY)
Admission: AD | Disposition: A | Discharge status: Home / Self Care | Payer: Self-pay | Source: Home / Self Care | Attending: Family Medicine

## 2021-02-27 ENCOUNTER — Inpatient Hospital Stay (HOSPITAL_COMMUNITY): Payer: Medicaid Other | Admitting: Certified Registered Nurse Anesthetist

## 2021-02-27 DIAGNOSIS — Z3A40 40 weeks gestation of pregnancy: Secondary | ICD-10-CM | POA: Diagnosis not present

## 2021-02-27 DIAGNOSIS — Z302 Encounter for sterilization: Secondary | ICD-10-CM | POA: Diagnosis not present

## 2021-02-27 DIAGNOSIS — O480 Post-term pregnancy: Secondary | ICD-10-CM | POA: Diagnosis not present

## 2021-02-27 HISTORY — PX: TUBAL LIGATION: SHX77

## 2021-02-27 LAB — CBC
HCT: 31.3 % — ABNORMAL LOW (ref 36.0–46.0)
Hemoglobin: 10.3 g/dL — ABNORMAL LOW (ref 12.0–15.0)
MCH: 31.5 pg (ref 26.0–34.0)
MCHC: 32.9 g/dL (ref 30.0–36.0)
MCV: 95.7 fL (ref 80.0–100.0)
Platelets: 254 10*3/uL (ref 150–400)
RBC: 3.27 MIL/uL — ABNORMAL LOW (ref 3.87–5.11)
RDW: 15 % (ref 11.5–15.5)
WBC: 14.6 10*3/uL — ABNORMAL HIGH (ref 4.0–10.5)
nRBC: 0 % (ref 0.0–0.2)

## 2021-02-27 LAB — RPR: RPR Ser Ql: NONREACTIVE

## 2021-02-27 SURGERY — LIGATION, FALLOPIAN TUBE, POSTPARTUM
Anesthesia: Epidural

## 2021-02-27 SURGERY — LIGATION, FALLOPIAN TUBE, POSTPARTUM
Anesthesia: Epidural | Laterality: Bilateral

## 2021-02-27 MED ORDER — COCONUT OIL OIL: 1.0000 "application " | TOPICAL_OIL | Status: DC | PRN

## 2021-02-27 MED ORDER — FENTANYL CITRATE (PF) 100 MCG/2ML IJ SOLN
INTRAMUSCULAR | Status: AC
  Filled 2021-02-27: qty 2

## 2021-02-27 MED ORDER — ONDANSETRON HCL 4 MG PO TABS: 4.0000 mg | ORAL_TABLET | ORAL | Status: DC | PRN

## 2021-02-27 MED ORDER — SENNOSIDES-DOCUSATE SODIUM 8.6-50 MG PO TABS
2.0000 | ORAL_TABLET | Freq: Every day | ORAL | Status: DC
  Administered 2021-02-27 – 2021-02-28 (×2): 2 via ORAL
  Filled 2021-02-27 (×2): qty 2

## 2021-02-27 MED ORDER — FENTANYL CITRATE (PF) 100 MCG/2ML IJ SOLN
INTRAMUSCULAR | Status: DC | PRN
  Administered 2021-02-27: 50 ug via INTRAVENOUS

## 2021-02-27 MED ORDER — MIDAZOLAM HCL 5 MG/5ML IJ SOLN
INTRAMUSCULAR | Status: DC | PRN
  Administered 2021-02-27: 2 mg via INTRAVENOUS

## 2021-02-27 MED ORDER — FENTANYL CITRATE (PF) 100 MCG/2ML IJ SOLN
25.0000 ug | INTRAMUSCULAR | Status: DC | PRN
  Administered 2021-02-27 (×2): 50 ug via INTRAVENOUS

## 2021-02-27 MED ORDER — BUPIVACAINE HCL (PF) 0.25 % IJ SOLN
INTRAMUSCULAR | Status: DC | PRN
  Administered 2021-02-27: 10 mL

## 2021-02-27 MED ORDER — ONDANSETRON HCL 4 MG/2ML IJ SOLN
INTRAMUSCULAR | Status: DC | PRN
  Administered 2021-02-27: 4 mg via INTRAVENOUS

## 2021-02-27 MED ORDER — PROMETHAZINE HCL 25 MG/ML IJ SOLN: 6.2500 mg | INTRAMUSCULAR | Status: DC | PRN

## 2021-02-27 MED ORDER — OXYCODONE HCL 5 MG PO TABS
5.0000 mg | ORAL_TABLET | ORAL | Status: DC | PRN
Start: 2021-02-27 — End: 2021-02-28
  Administered 2021-02-27: 5 mg via ORAL

## 2021-02-27 MED ORDER — WITCH HAZEL-GLYCERIN EX PADS: 1.0000 "application " | MEDICATED_PAD | CUTANEOUS | Status: DC | PRN

## 2021-02-27 MED ORDER — OXYCODONE HCL 5 MG PO TABS
10.0000 mg | ORAL_TABLET | ORAL | Status: DC | PRN
  Administered 2021-02-27 – 2021-02-28 (×5): 10 mg via ORAL
  Filled 2021-02-27 (×5): qty 2

## 2021-02-27 MED ORDER — LIDOCAINE-EPINEPHRINE (PF) 2 %-1:200000 IJ SOLN
INTRAMUSCULAR | Status: DC | PRN
  Administered 2021-02-27: 7 mL via INTRADERMAL
  Administered 2021-02-27: 3 mL via INTRADERMAL
  Administered 2021-02-27: 5 mL via INTRADERMAL
  Administered 2021-02-27: 3 mL via INTRADERMAL

## 2021-02-27 MED ORDER — MIDAZOLAM HCL 2 MG/2ML IJ SOLN
INTRAMUSCULAR | Status: AC
  Filled 2021-02-27: qty 2

## 2021-02-27 MED ORDER — METOCLOPRAMIDE HCL 10 MG PO TABS
10.0000 mg | ORAL_TABLET | Freq: Once | ORAL | Status: AC
  Administered 2021-02-27: 10 mg via ORAL
  Filled 2021-02-27: qty 1

## 2021-02-27 MED ORDER — TETANUS-DIPHTH-ACELL PERTUSSIS 5-2.5-18.5 LF-MCG/0.5 IM SUSY: 0.5000 mL | PREFILLED_SYRINGE | Freq: Once | INTRAMUSCULAR | Status: DC

## 2021-02-27 MED ORDER — DIPHENHYDRAMINE HCL 25 MG PO CAPS: 25.0000 mg | ORAL_CAPSULE | Freq: Four times a day (QID) | ORAL | Status: DC | PRN

## 2021-02-27 MED ORDER — ONDANSETRON HCL 4 MG/2ML IJ SOLN: 4.0000 mg | INTRAMUSCULAR | Status: DC | PRN

## 2021-02-27 MED ORDER — ACETAMINOPHEN 325 MG PO TABS: 650.0000 mg | ORAL_TABLET | ORAL | Status: DC | PRN

## 2021-02-27 MED ORDER — BENZOCAINE-MENTHOL 20-0.5 % EX AERO: 1.0000 "application " | INHALATION_SPRAY | CUTANEOUS | Status: DC | PRN

## 2021-02-27 MED ORDER — FAMOTIDINE 20 MG PO TABS
40.0000 mg | ORAL_TABLET | Freq: Once | ORAL | Status: AC
  Administered 2021-02-27: 40 mg via ORAL
  Filled 2021-02-27: qty 2

## 2021-02-27 MED ORDER — PRENATAL MULTIVITAMIN CH
1.0000 | ORAL_TABLET | Freq: Every day | ORAL | Status: DC
  Administered 2021-02-28: 1 via ORAL
  Filled 2021-02-27: qty 1

## 2021-02-27 MED ORDER — DIBUCAINE (PERIANAL) 1 % EX OINT: 1.0000 "application " | TOPICAL_OINTMENT | CUTANEOUS | Status: DC | PRN

## 2021-02-27 MED ORDER — ONDANSETRON HCL 4 MG/2ML IJ SOLN
INTRAMUSCULAR | Status: AC
  Filled 2021-02-27: qty 2

## 2021-02-27 MED ORDER — BUPIVACAINE HCL (PF) 0.25 % IJ SOLN
INTRAMUSCULAR | Status: AC
  Filled 2021-02-27: qty 30

## 2021-02-27 MED ORDER — ZOLPIDEM TARTRATE 5 MG PO TABS: 5.0000 mg | ORAL_TABLET | Freq: Every evening | ORAL | Status: DC | PRN

## 2021-02-27 MED ORDER — LACTATED RINGERS IV SOLN
INTRAVENOUS | Status: DC
  Administered 2021-02-27: 20 mL/h via INTRAVENOUS

## 2021-02-27 MED ORDER — SIMETHICONE 80 MG PO CHEW: 80.0000 mg | CHEWABLE_TABLET | ORAL | Status: DC | PRN

## 2021-02-27 SURGICAL SUPPLY — 21 items
CLIP FILSHIE TUBAL LIGA STRL (Clip) ×1 IMPLANT
CLOTH BEACON ORANGE TIMEOUT ST (SAFETY) ×2 IMPLANT
DRSG OPSITE POSTOP 3X4 (GAUZE/BANDAGES/DRESSINGS) ×2 IMPLANT
DURAPREP 26ML APPLICATOR (WOUND CARE) ×2 IMPLANT
GLOVE BIOGEL PI IND STRL 7.0 (GLOVE) ×2 IMPLANT
GLOVE BIOGEL PI INDICATOR 7.0 (GLOVE) ×2
GLOVE ECLIPSE 7.0 STRL STRAW (GLOVE) ×4 IMPLANT
GOWN STRL REUS W/TWL LRG LVL3 (GOWN DISPOSABLE) ×4 IMPLANT
NEEDLE HYPO 22GX1.5 SAFETY (NEEDLE) ×2 IMPLANT
NS IRRIG 1000ML POUR BTL (IV SOLUTION) ×2 IMPLANT
PACK ABDOMINAL MINOR (CUSTOM PROCEDURE TRAY) ×2 IMPLANT
PROTECTOR NERVE ULNAR (MISCELLANEOUS) ×2 IMPLANT
SPONGE LAP 4X18 RFD (DISPOSABLE) IMPLANT
SUT MON AB-0 CT1 36 (SUTURE) IMPLANT
SUT VIC AB 0 CT1 27 (SUTURE) ×1
SUT VIC AB 0 CT1 27XBRD ANBCTR (SUTURE) ×1 IMPLANT
SUT VICRYL 4-0 PS2 18IN ABS (SUTURE) ×2 IMPLANT
SYR CONTROL 10ML LL (SYRINGE) ×2 IMPLANT
TOWEL OR 17X24 6PK STRL BLUE (TOWEL DISPOSABLE) ×4 IMPLANT
TRAY FOLEY CATH SILVER 14FR (SET/KITS/TRAYS/PACK) ×2 IMPLANT
WATER STERILE IRR 1000ML POUR (IV SOLUTION) ×2 IMPLANT

## 2021-02-27 NOTE — Transfer of Care (Signed)
Immediate Anesthesia Transfer of Care Note  Patient: Martha Johnson  Procedure(s) Performed: POST PARTUM TUBAL LIGATION (Bilateral)  Patient Location: PACU  Anesthesia Type:Epidural  Level of Consciousness: awake, alert  and oriented  Airway & Oxygen Therapy: Patient Spontanous Breathing  Post-op Assessment: Report given to RN and Post -op Vital signs reviewed and stable  Post vital signs: Reviewed and stable  Last Vitals:  Vitals Value Taken Time  BP    Temp    Pulse    Resp    SpO2      Last Pain:  Vitals:   02/27/21 0842  TempSrc: Oral  PainSc:       Patients Stated Pain Goal: 4 (02/26/21 1439)  Complications: No notable events documented.

## 2021-02-27 NOTE — Lactation Note (Signed)
This note was copied from a baby's chart. Lactation Consultation Note  Patient Name: Martha Johnson Today's Date: 02/27/2021 Reason for consult: Initial assessment Age:32 hours, P2, term , hx of hypothyroidism and presently not being tx per mom , under control.  As LC entered the room baby mom trying to latch.  LC offered to assist/ and switch the baby to the left breast / on and off baby unable to sustain latch. Switched back to the right breast, and baby latched with depth, swallows and still feeding at 10 mins. Per mom comfortable. Latch of 8  LC noted semi compressible areolas . Indicating the need for shells while awake.  Mom is going for a tubal this am.   LC provided the [facility] brochure with resource numbers and BFSG .  Maternal Data    Feeding    LATCH Score Latch: Grasps breast easily, tongue down, lips flanged, rhythmical sucking.  Audible Swallowing: A few with stimulation  Type of Nipple: Everted at rest and after stimulation (semi compressible)  Comfort (Breast/Nipple): Soft / non-tender  Hold (Positioning): Assistance needed to correctly position infant at breast and maintain latch.  LATCH Score: 8   Lactation Tools Discussed/Used    Interventions Interventions: Breast feeding basics reviewed;Assisted with latch;Skin to skin;Breast massage;Hand express;Breast compression;Adjust position;Position options;Education  Discharge Pump: Personal;DEBP  Consult Status      Matilde Sprang [facility] 02/27/2021, 8:41 AM

## 2021-02-27 NOTE — Anesthesia Preprocedure Evaluation (Signed)
Anesthesia Evaluation  Patient identified by MRN, date of birth, ID band Patient awake    Reviewed: Allergy & Precautions, NPO status , Patient's Chart, lab work & pertinent test results  Airway Mallampati: II  TM Distance: >3 FB     Dental   Pulmonary neg pulmonary ROS,    Pulmonary exam normal        Cardiovascular negative cardio ROS Normal cardiovascular exam     Neuro/Psych negative neurological ROS     GI/Hepatic negative GI ROS, Neg liver ROS,   Endo/Other  negative endocrine ROS  Renal/GU negative Renal ROS     Musculoskeletal   Abdominal   Peds  Hematology negative hematology ROS (+)   Anesthesia Other Findings   Reproductive/Obstetrics (+) Pregnancy                             Lab Results  Component Value Date   WBC 14.6 (H) 02/27/2021   HGB 10.3 (L) 02/27/2021   HCT 31.3 (L) 02/27/2021   MCV 95.7 02/27/2021   PLT 254 02/27/2021    Anesthesia Physical  Anesthesia Plan  ASA: 2  Anesthesia Plan: Epidural   Post-op Pain Management:    Induction:   PONV Risk Score and Plan: 2 and Treatment may vary due to age or medical condition  Airway Management Planned: Natural Airway  Additional Equipment:   Intra-op Plan:   Post-operative Plan:   Informed Consent: I have reviewed the patients History and Physical, chart, labs and discussed the procedure including the risks, benefits and alternatives for the proposed anesthesia with the patient or authorized representative who has indicated his/her understanding and acceptance.       Plan Discussed with: CRNA  Anesthesia Plan Comments:         Anesthesia Quick Evaluation

## 2021-02-27 NOTE — Op Note (Signed)
Martha Johnson  02/27/2021  PREOPERATIVE DIAGNOSIS:  Multiparity, undesired fertility  POSTOPERATIVE DIAGNOSIS:  Multiparity, undesired fertility  PROCEDURE:  Postpartum Bilateral Tubal Sterilization using Salpingectomy  ANESTHESIA:  Epidural and local analgesia using 0.25% Marcaine  COMPLICATIONS:  None immediate  ESTIMATED BLOOD LOSS: 5 ml  INDICATIONS: 30 y.o. [id number] with undesired fertility,status post vaginal delivery, desires permanent sterilization.  Other reversible forms of contraception were discussed with patient; she declines all other modalities. Risks of procedure discussed with patient including but not limited to: risk of regret, permanence of method, bleeding, infection, injury to surrounding organs and need for additional procedures.  Failure risk of 0.5-1% with increased risk of ectopic gestation if pregnancy occurs was also discussed with patient.     FINDINGS:  Normal uterus, tubes, and ovaries.  PROCEDURE DETAILS: The patient was taken to the operating room where her epidural anesthesia was dosed up to surgical level and found to be adequate.  She was then placed in a supine position and prepped and draped in the usual sterile fashion.  After an adequate timeout was performed, attention was turned to the patient's abdomen where a small transverse skin incision was made under the umbilical fold. The incision was taken down to the layer of fascia using the scalpel, and fascia was incised, and extended bilaterally. The peritoneum was entered in a sharp fashion. The patient was placed in Trendelenburg.  The left fallopian tube was identified and grasped with a Babcock clamp, and followed out to the fimbriated end.  Kelly forceps were placed on the mesosalpinx underneath most of the tube.  This pedicle was double suture ligated with 2-0 Vicryl, and the tube including the fimbriated end was excised.  The right fallopian tube was then identified, doubly ligated, and was excised  in a similar fashion allowing for bilateral tubal sterilization via bilateral salpingectomy.  Good hemostasis was noted overall.  The instruments were then removed from the patient's abdomen and the fascial incision was repaired with 0 Vicryl, and the skin was closed with a 4-0 Vicryl subcuticular stitch.  The patient tolerated the procedure well.  Sponge, lap, and needle counts were correct times two.  The patient was then taken to the recovery room awake and in stable condition.  Worthy Rancher M.D. Obstetrics Fellow  Faculty Practice 02/27/2021 10:28 AM

## 2021-02-27 NOTE — Progress Notes (Addendum)
POSTPARTUM PROGRESS NOTE  Subjective: Martha Johnson is a 30 y.o. [id number] s/p SVD at [redacted]w[redacted]d.  She reports she doing well. No acute events overnight. She denies any problems with ambulating, voiding or po intake. Denies nausea or vomiting. Pain is well controlled.  Lochia is appropriate.  Objective: Blood pressure 103/71, pulse 82, temperature 97.8 F (36.6 C), temperature source Oral, resp. rate 18, height 5\' 9"  (1.753 m), weight 108.8 kg, last menstrual period 05/17/2020, SpO2 98 %, unknown if currently breastfeeding.  Physical Exam:  General: alert, cooperative and no distress Chest: no respiratory distress Abdomen: soft, non-tender  Uterine Fundus: firm and at level of umbilicus Extremities: No calf swelling or tenderness  no edema  Recent Labs    02/26/21 1321  HGB 11.1*  HCT 34.1*    Assessment/Plan: Charita Prew is a 30 y.o. 34 s/p SVD at [redacted]w[redacted]d for SOL.  Routine Postpartum Care: Doing well, pain well-controlled.  -- Continue routine care, lactation support  -- Contraception: BTL scheduled this AM -- Feeding: Both  Dispo: Plan for discharge tomorrow.  [redacted]w[redacted]d, MD 02/27/2021 7:22 AM

## 2021-02-27 NOTE — Anesthesia Postprocedure Evaluation (Signed)
Anesthesia Post Note  Patient: Martha Johnson  Procedure(s) Performed: POST PARTUM TUBAL LIGATION (Bilateral)     Patient location during evaluation: PACU Anesthesia Type: Epidural Level of consciousness: awake and alert Pain management: pain level controlled Vital Signs Assessment: post-procedure vital signs reviewed and stable Respiratory status: spontaneous breathing and respiratory function stable Cardiovascular status: blood pressure returned to baseline and stable Postop Assessment: epidural receding Anesthetic complications: no   No notable events documented.  Last Vitals:  Vitals:   02/27/21 1226 02/27/21 1314  BP: 115/76 119/78  Pulse: 74 78  Resp: 17 18  Temp: 36.6 C 36.7 C  SpO2: 100% 100%    Last Pain:  Vitals:   02/27/21 1317  TempSrc:   PainSc: 3    Pain Goal: Patients Stated Pain Goal: 3 (02/27/21 1317)                 Kennieth Rad

## 2021-02-27 NOTE — Anesthesia Postprocedure Evaluation (Signed)
Anesthesia Post Note  Patient: Martha Johnson  Procedure(s) Performed: AN AD HOC LABOR EPIDURAL     Patient location during evaluation: Mother Baby Anesthesia Type: Epidural Level of consciousness: awake and alert Pain management: pain level controlled Vital Signs Assessment: post-procedure vital signs reviewed and stable Respiratory status: spontaneous breathing, nonlabored ventilation and respiratory function stable Cardiovascular status: stable Postop Assessment: no headache, no backache and epidural receding Anesthetic complications: no   No notable events documented.  Last Vitals:  Vitals:   02/27/21 1226 02/27/21 1314  BP: 115/76 119/78  Pulse: 74 78  Resp: 17 18  Temp: 36.6 C 36.7 C  SpO2: 100% 100%    Last Pain:  Vitals:   02/27/21 1559  TempSrc:   PainSc: 10-Worst pain ever   Pain Goal: Patients Stated Pain Goal: 3 (02/27/21 1317)                 Rica Records

## 2021-02-27 NOTE — Clinical Social Work Maternal (Signed)
CLINICAL SOCIAL WORK MATERNAL/CHILD NOTE  Patient Details  Name: Martha Johnson MRN: [id number] Date of Birth: 06/29/1991  Date:  02/27/2021  Clinical Social Worker Initiating Note:  Martha Johnson, MSW, LCSWA Date/Time: Initiated:  02/27/21/1550     Child's Name:  Martha Johnson   Biological Parents:  Mother, Father (Martha Johnson, 12/17/ 79, [phone])   Need for Interpreter:  None   Reason for Referral:  Current Substance Use/Substance Use During Pregnancy     Address:  [address] [place] [address]    Phone number:  [phone] (home)     Additional phone number: [phone] (Martha Johnson, FOB)  Household Members/Support Persons (HM/SP):   Household Member/Support Person 1   HM/SP Name Relationship DOB or Age  HM/SP -1 Martha Johnson Daughter 03/19/08  HM/SP -2        HM/SP -3        HM/SP -4        HM/SP -5        HM/SP -6        HM/SP -7        HM/SP -8          Natural Supports (not living in the home):  Spouse/significant other, Parent (mother)   Professional Supports: None   Employment: Unemployed   Type of Work:     Education:  Other (comment) (11th grade)   Homebound arranged:    Financial Resources:  Medicaid   Other Resources:  Food Stamps  , WIC   Cultural/Religious Considerations Which May Impact Care:    Strengths:  Ability to meet basic needs  , Home prepared for child     Psychotropic Medications:         Pediatrician:       Pediatrician List:   [place]    [place]    [place]    [place]    [place]    [place]      Pediatrician Fax Number:    Risk Factors/Current Problems:  Substance Use     Cognitive State:  Able to Concentrate  , Alert  , Goal Oriented  , Insightful  , Linear Thinking     Mood/Affect:  Interested  , Comfortable  , Calm  , Relaxed     CSW Assessment: CSW met with MOB to complete consult for substance use during pregnancy. CSW observed MOB resting in bed,  bonding with infant, and FOB at couch. MOB gave CSW verbal consent to complete consult while FOB was present. CSW explained role, and reason for consult. MOB was pleasant, polite, and engaged with CSW. MOB reported, history of THC, and her last use was three months ago. MOB reported, the reason for THC use, was for recreational purposes. MOB denied any other illicit substances use, and CPS involvement. CSW inform MOB of Drug Screen Policy, and MOB was understanding of protocol. CSW will continue to follow the CDS, and will make CPS report if warranted.   CSW provided education regarding the baby blues period vs. perinatal mood disorders, discussed treatment and gave resources for mental health follow up if concerns arise. CSW recommends self- evaluation during the postpartum time period using the New Mom Checklist from Postpartum Progress and encouraged MOB to contact a medical professional if symptoms are noted at any time.   When CSW asked MOB of her emotions since delivery. MOB reported, she feels, "fine". MOB reported, FOB, and her mother are her supports. MOB denied SI, and HI when CSW   assessed for safety.   MOB reported, she receive both WIC, and food stamps. MOB reported, she is unsure where infant's pediatrician will be at, but there are no barriers to follow up infant's care. MOB reported, medical team stated, they would provide her with peds list. MOB reported, she has all essentials needed to care for infant. MOB reported, infant has a car seat, and bassinet. MOB denied any additional barriers.     CSW provided education on sudden infant death syndrome (SIDS).  CSW will continue to follow the CDS, and will make CPS report if warranted.   CSW Plan/Description:  No Further Intervention Required/No Barriers to Discharge, Sudden Infant Death Syndrome (SIDS) Education, Perinatal Mood and Anxiety Disorder (PMADs) Education, Hospital Drug Screen Policy Information, CSW Will Continue to Monitor  Umbilical Cord Tissue Drug Screen Results and Make Report if Warranted    Martha Johnson, LCSWA 02/27/2021, 3:54 PM  Martha Johnson, MSW, LCSW-A Clinical Social Worker- Weekends [phone]  

## 2021-02-28 ENCOUNTER — Inpatient Hospital Stay (HOSPITAL_COMMUNITY): Payer: Medicaid Other

## 2021-02-28 ENCOUNTER — Encounter (HOSPITAL_COMMUNITY): Payer: Self-pay | Admitting: Obstetrics & Gynecology

## 2021-02-28 ENCOUNTER — Inpatient Hospital Stay (HOSPITAL_COMMUNITY): Admission: AD | Admit: 2021-02-28 | Payer: Medicaid Other | Source: Home / Self Care | Admitting: Family Medicine

## 2021-02-28 MED ORDER — OXYCODONE HCL 5 MG PO TABS: 5.0000 mg | ORAL_TABLET | ORAL | 0 refills | Status: DC | PRN

## 2021-02-28 MED ORDER — POLYETHYLENE GLYCOL 3350 17 GM/SCOOP PO POWD: 17.0000 g | Freq: Every day | ORAL | 2 refills | Status: DC | PRN

## 2021-02-28 MED ORDER — IBUPROFEN 600 MG PO TABS: 600.0000 mg | ORAL_TABLET | Freq: Four times a day (QID) | ORAL | 0 refills | Status: DC

## 2021-02-28 NOTE — Lactation Note (Signed)
This note was copied from a baby's chart. Lactation Consultation Note  Patient Name: Girl Ritha Sampedro GNOIB'[id number] Date: 02/28/2021 Reason for consult: Follow-up assessment;Term;Infant weight loss;Other (Comment) (6 % weight loss/ @ 32 hours Bili check 3.9/ per mom baby last fed at 11:30am for 30 mins / swallows and comfort) Age:32 hours LC reviewed BF D/C teaching/ see below LC reviewed the doc flow sheets  and updated per mom.  Mom has the [facility] resource brochure and BFSG.   Maternal Data Has patient been taught Hand Expression?: Yes  Feeding Mother's Current Feeding Choice: Breast Milk  LATCH Score                    Lactation Tools Discussed/Used Tools: Pump;Flanges Flange Size: 24;27 Breast pump type: Manual Pump Education: Milk Storage;Setup, frequency, and cleaning Reason for Pumping: PRN  Interventions Interventions: Breast feeding basics reviewed;Education;Hand pump  Discharge Discharge Education: Engorgement and breast care;Warning signs for feeding baby Pump: Personal;Manual;DEBP  Consult Status Consult Status: Complete Date: 02/28/21    Matilde Sprang Prestina Raigoza 02/28/2021, 12:12 PM

## 2021-02-28 NOTE — Discharge Summary (Signed)
Postpartum Discharge Summary      Patient Name: Martha Johnson DOB: 04/02/1991 MRN: [id number]  Date of admission: 02/26/2021 Delivery date:02/26/2021  Delivering provider: Florian Buff  Date of discharge: 02/28/2021  Admitting diagnosis: Post term pregnancy over 40 weeks [O48.0] Intrauterine pregnancy: [redacted]w[redacted]d    Secondary diagnosis:  Active Problems:   Indication for care in labor or delivery   Marijuana use   Post term pregnancy over 40 weeks   Vaginal delivery  Additional problems: Undesired fertility    Discharge diagnosis: Term Pregnancy Delivered                                              Post partum procedures:postpartum tubal ligation Augmentation: N/A Complications: None  Hospital course: Onset of Labor With Vaginal Delivery      30y.o. yo [id number] admitted in Latent Labor on 02/26/2021. Patient had an uncomplicated labor course as follows:  Membrane Rupture Time/Date: 4:35 PM ,02/26/2021   Delivery Method:Vaginal, Spontaneous  Episiotomy: None  Lacerations:  2nd degree;Perineal  Patient had an uncomplicated postpartum course.  She is ambulating, tolerating a regular diet, passing flatus, and urinating well. Patient is discharged home in stable condition on 02/28/21.  Newborn Data: Birth date:02/26/2021  Birth time:9:42 PM  Gender:Female  Living status:Living  Apgars:8 ,9  Weight:3365 g   Magnesium Sulfate received: No BMZ received: No Rhophylac:N/A MMR:N/A T-DaP:Given prenatally Flu: N/A Transfusion:No  Physical exam  Vitals:   02/27/21 2230 02/28/21 0240 02/28/21 0533 02/28/21 0835  BP: 115/68 102/66 120/76 105/68  Pulse: 79 80 74 67  Resp: '18 18 18 18  ' Temp: 97.6 F (36.4 C) (!) 97.5 F (36.4 C) 97.6 F (36.4 C) 97.8 F (36.6 C)  TempSrc: Oral Oral Oral Oral  SpO2: 100% 100% 100% 100%  Weight:      Height:       General: alert, cooperative, and no distress Lochia: appropriate Uterine Fundus: firm Incision: No  significant erythema, Dressing is clean, dry, and intact DVT Evaluation: No evidence of DVT seen on physical exam. Labs: Lab Results  Component Value Date   WBC 14.6 (H) 02/27/2021   HGB 10.3 (L) 02/27/2021   HCT 31.3 (L) 02/27/2021   MCV 95.7 02/27/2021   PLT 254 02/27/2021   CMP Latest Ref Rng & Units 07/07/2020  Glucose 70 - 99 mg/dL 86  BUN 6 - 20 mg/dL 7  Creatinine 0.44 - 1.00 mg/dL 0.58  Sodium 135 - 145 mmol/L 134(L)  Potassium 3.5 - 5.1 mmol/L 3.7  Chloride 98 - 111 mmol/L 100  CO2 22 - 32 mmol/L 23  Calcium 8.9 - 10.3 mg/dL 9.1  Total Protein 6.5 - 8.1 g/dL 7.4  Total Bilirubin 0.3 - 1.2 mg/dL 0.7  Alkaline Phos 38 - 126 U/L 53  AST 15 - 41 U/L 19  ALT 0 - 44 U/L 16   Edinburgh Score: Edinburgh Postnatal Depression Scale Screening Tool 02/28/2021  I have been able to laugh and see the funny side of things. 2  I have looked forward with enjoyment to things. 0  I have blamed myself unnecessarily when things went wrong. 0  I have been anxious or worried for no good reason. 0  I have felt scared or panicky for no good reason. 1  Things have been getting on top of me. 1  I have been so unhappy that I have had difficulty sleeping. 0  I have felt sad or miserable. 0  I have been so unhappy that I have been crying. 0  The thought of harming myself has occurred to me. 0  Edinburgh Postnatal Depression Scale Total 4     After visit meds:  Allergies as of 02/28/2021       Reactions   Penicillins Other (See Comments)   Childhood reaction        Medication List     TAKE these medications    acetaminophen 500 MG tablet Commonly known as: TYLENOL Take 1,000 mg by mouth every 6 (six) hours as needed for mild pain, fever or headache.   ibuprofen 600 MG tablet Commonly known as: ADVIL Take 1 tablet (600 mg total) by mouth every 6 (six) hours.   oxyCODONE 5 MG immediate release tablet Commonly known as: Oxy IR/ROXICODONE Take 1 tablet (5 mg total) by mouth  every 4 (four) hours as needed for breakthrough pain (pain scale 4-7).   polyethylene glycol powder 17 GM/SCOOP powder Commonly known as: GLYCOLAX/MIRALAX Take 17 g by mouth daily as needed for moderate constipation.   prenatal multivitamin Tabs tablet Take 1 tablet by mouth daily at 12 noon.         Discharge home in stable condition Infant Feeding: Breast Infant Disposition:home with mother Discharge instruction: per After Visit Summary and Postpartum booklet. Activity: Advance as tolerated. Pelvic rest for 6 weeks.  Diet: routine diet Future Appointments:No future appointments. Follow up Visit:  Follow-up Information     Department, [facility] Follow up in 4 week(s).   Why: For postpartum visit Contact information: [place] [phone]         [facility] Follow up.   Specialty: Obstetrics and Gynecology Why: As needed in emergencies Contact information: [address] [phone] [place] [phone]                 Please schedule this patient for a In person postpartum visit in 4 weeks with the following provider: Any provider. Additional Postpartum F/U: None   Low risk pregnancy complicated by:  Nothing Delivery mode:  Vaginal, Spontaneous  Anticipated Birth Control:  BTL done [facility]   02/28/2021 Manya Silvas, CNM

## 2021-03-01 ENCOUNTER — Telehealth: Payer: Self-pay

## 2021-03-01 NOTE — Telephone Encounter (Signed)
Transition Care Management Unsuccessful Follow-up Telephone Call  Date of discharge and from where:  02/28/2021-[facility].   Attempts:  1st Attempt  Reason for unsuccessful TCM follow-up call:  Left voice message

## 2021-03-02 LAB — SURGICAL PATHOLOGY

## 2021-03-02 NOTE — Telephone Encounter (Signed)
Transition Care Management Unsuccessful Follow-up Telephone Call  Date of discharge and from where:  02/28/2021-[facility]  Attempts:  2nd Attempt  Reason for unsuccessful TCM follow-up call:  Unable to reach patient    

## 2021-03-03 NOTE — Telephone Encounter (Signed)
Transition Care Management Follow-up Telephone Call Date of discharge and from where: 02/28/2021-[facility]  How have you been since you were released from the hospital? Patient stated she is still in a little pain.  Any questions or concerns? No  Items Reviewed: Did the pt receive and understand the discharge instructions provided? Yes  Medications obtained and verified? Yes  Other? No  Any new allergies since your discharge? No  Dietary orders reviewed? N/A Do you have support at home? Yes   Home Care and Equipment/Supplies: Were home health services ordered? not applicable If so, what is the name of the agency? N/A  Has the agency set up a time to come to the patient's home? not applicable Were any new equipment or medical supplies ordered?  No What is the name of the medical supply agency? N/A Were you able to get the supplies/equipment? not applicable Do you have any questions related to the use of the equipment or supplies? No  Functional Questionnaire: (I = Independent and D = Dependent) ADLs: I  Bathing/Dressing- I  Meal Prep- I  Eating- I  Maintaining continence- I  Transferring/Ambulation- I  Managing Meds- I  Follow up appointments reviewed:  PCP Hospital f/u appt confirmed? No  Specialist Hospital f/u appt confirmed? No   Are transportation arrangements needed? No  If their condition worsens, is the pt aware to call PCP or go to the Emergency Dept.? Yes Was the patient provided with contact information for the PCP's office or ED? Yes Was to pt encouraged to call back with questions or concerns? Yes

## 2021-03-10 ENCOUNTER — Telehealth (HOSPITAL_COMMUNITY): Payer: Self-pay | Admitting: *Deleted

## 2021-03-10 NOTE — Telephone Encounter (Signed)
Mom reports feeling good. Has had swelling of her legs and made an appt to see OB tomorrow. EPDS = 0 (hospital score = 4) Mom reports baby is well. No concerns about baby. Breastfeeding well. Peeing and pooping without difficulty. No concerns about baby.  Duffy Rhody, RN 03-10-2021 at 1:59pm

## 2022-11-21 ENCOUNTER — Other Ambulatory Visit: Payer: Self-pay

## 2023-02-02 ENCOUNTER — Ambulatory Visit: Payer: Medicaid Other | Admitting: Family Medicine

## 2023-02-06 ENCOUNTER — Ambulatory Visit: Payer: Medicaid Other | Admitting: Family

## 2024-03-13 DIAGNOSIS — R1030 Lower abdominal pain, unspecified: Secondary | ICD-10-CM | POA: Diagnosis not present

## 2024-03-13 DIAGNOSIS — K5900 Constipation, unspecified: Secondary | ICD-10-CM | POA: Diagnosis not present

## 2024-03-13 DIAGNOSIS — M545 Low back pain, unspecified: Secondary | ICD-10-CM | POA: Diagnosis not present

## 2024-03-13 DIAGNOSIS — K648 Other hemorrhoids: Secondary | ICD-10-CM | POA: Diagnosis not present

## 2024-05-27 ENCOUNTER — Ambulatory Visit: Payer: Self-pay

## 2024-05-27 ENCOUNTER — Other Ambulatory Visit: Payer: Self-pay

## 2024-05-27 ENCOUNTER — Encounter (HOSPITAL_COMMUNITY): Payer: Self-pay | Admitting: *Deleted

## 2024-05-27 ENCOUNTER — Emergency Department (HOSPITAL_COMMUNITY)
Admission: EM | Admit: 2024-05-27 | Discharge: 2024-05-27 | Discharge status: Against Medical Advice | Attending: Physician Assistant | Admitting: Physician Assistant

## 2024-05-27 DIAGNOSIS — R109 Unspecified abdominal pain: Secondary | ICD-10-CM | POA: Diagnosis not present

## 2024-05-27 DIAGNOSIS — R112 Nausea with vomiting, unspecified: Secondary | ICD-10-CM | POA: Diagnosis present

## 2024-05-27 DIAGNOSIS — Z5321 Procedure and treatment not carried out due to patient leaving prior to being seen by health care provider: Secondary | ICD-10-CM | POA: Insufficient documentation

## 2024-05-27 DIAGNOSIS — R079 Chest pain, unspecified: Secondary | ICD-10-CM | POA: Insufficient documentation

## 2024-05-27 DIAGNOSIS — R0789 Other chest pain: Secondary | ICD-10-CM | POA: Diagnosis not present

## 2024-05-27 LAB — URINALYSIS, ROUTINE W REFLEX MICROSCOPIC
Bacteria, UA: NONE SEEN
Bilirubin Urine: NEGATIVE
Glucose, UA: NEGATIVE mg/dL
Ketones, ur: 80 mg/dL — AB
Leukocytes,Ua: NEGATIVE
Nitrite: NEGATIVE
Protein, ur: 30 mg/dL — AB
Specific Gravity, Urine: 1.021 (ref 1.005–1.030)
pH: 6 (ref 5.0–8.0)

## 2024-05-27 LAB — CBC WITH DIFFERENTIAL/PLATELET
Abs Immature Granulocytes: 0.02 K/uL (ref 0.00–0.07)
Basophils Absolute: 0 K/uL (ref 0.0–0.1)
Basophils Relative: 0 %
Eosinophils Absolute: 0 K/uL (ref 0.0–0.5)
Eosinophils Relative: 0 %
HCT: 45.9 % (ref 36.0–46.0)
Hemoglobin: 15.3 g/dL — ABNORMAL HIGH (ref 12.0–15.0)
Immature Granulocytes: 0 %
Lymphocytes Relative: 17 %
Lymphs Abs: 1.9 K/uL (ref 0.7–4.0)
MCH: 32.4 pg (ref 26.0–34.0)
MCHC: 33.3 g/dL (ref 30.0–36.0)
MCV: 97.2 fL (ref 80.0–100.0)
Monocytes Absolute: 0.4 K/uL (ref 0.1–1.0)
Monocytes Relative: 4 %
Neutro Abs: 8.6 K/uL — ABNORMAL HIGH (ref 1.7–7.7)
Neutrophils Relative %: 79 %
Platelets: 359 K/uL (ref 150–400)
RBC: 4.72 MIL/uL (ref 3.87–5.11)
RDW: 13.3 % (ref 11.5–15.5)
WBC: 11 K/uL — ABNORMAL HIGH (ref 4.0–10.5)
nRBC: 0 % (ref 0.0–0.2)

## 2024-05-27 LAB — COMPREHENSIVE METABOLIC PANEL WITH GFR
ALT: 13 U/L (ref 0–44)
AST: 18 U/L (ref 15–41)
Albumin: 4.6 g/dL (ref 3.5–5.0)
Alkaline Phosphatase: 57 U/L (ref 38–126)
Anion gap: 16 — ABNORMAL HIGH (ref 5–15)
BUN: 5 mg/dL — ABNORMAL LOW (ref 6–20)
CO2: 18 mmol/L — ABNORMAL LOW (ref 22–32)
Calcium: 9.5 mg/dL (ref 8.9–10.3)
Chloride: 106 mmol/L (ref 98–111)
Creatinine, Ser: 0.63 mg/dL (ref 0.44–1.00)
GFR, Estimated: >60 mL/min (ref >60)
Glucose, Bld: 76 mg/dL (ref 70–99)
Potassium: 3.7 mmol/L (ref 3.5–5.1)
Sodium: 140 mmol/L (ref 135–145)
Total Bilirubin: 1.4 mg/dL — ABNORMAL HIGH (ref 0.0–1.2)
Total Protein: 8.7 g/dL — ABNORMAL HIGH (ref 6.5–8.1)

## 2024-05-27 LAB — HCG, SERUM, QUALITATIVE: Preg, Serum: NEGATIVE

## 2024-05-27 LAB — LIPASE, BLOOD: Lipase: 30 U/L (ref 11–51)

## 2024-05-27 LAB — MAGNESIUM: Magnesium: 2.1 mg/dL (ref 1.7–2.4)

## 2024-05-27 LAB — TROPONIN I (HIGH SENSITIVITY): Troponin I (High Sensitivity): 2 ng/L (ref <18)

## 2024-05-27 MED ORDER — ONDANSETRON 4 MG PO TBDP
4.0000 mg | ORAL_TABLET | Freq: Once | ORAL | Status: AC
  Administered 2024-05-27: 4 mg via ORAL
  Filled 2024-05-27: qty 1

## 2024-05-27 NOTE — ED Notes (Signed)
 Pt seen getting into a car and leaving by security. Moved OTF.

## 2024-05-27 NOTE — Telephone Encounter (Signed)
 FYI Only or Action Required?: FYI only for provider: ED advised.  Patient was last seen in primary care on New Patient.  Called Nurse Triage reporting Emesis.  Symptoms began yesterday.  Interventions attempted: Rest, hydration, or home remedies.  Symptoms are: unchanged.  Triage Disposition: Go to ED Now (or PCP Triage)  Patient/caregiver understands and will follow disposition?: Yes        Copied from CRM [id number]. Topic: Clinical - Red Word Triage >> May 27, 2024 10:46 AM Susanna ORN wrote: Red Word that prompted transfer to Nurse Triage: Patient states she took a Wegovy shot, 2.4 mg that she got from someone and she's been throwing up for two days. Wants to know if it will go or away or should she go to the hospital? Reason for Disposition  [1] SEVERE vomiting (e.g., 6 or more times/day) AND [2] present > 8 hours (Exception: Patient sounds well, is drinking liquids, does not sound dehydrated, and vomiting has lasted less than 24 hours.)  Answer Assessment - Initial Assessment Questions 1. VOMITING SEVERITY: How many times have you vomited in the past 24 hours?      X 20  2. ONSET: When did the vomiting begin?      X1 day   3. FLUIDS: What fluids or food have you vomited up today? Have you been able to keep any fluids down?     She is unable to keep down liquids   4. ABDOMEN PAIN: Are your having any abdomen pain? If Yes : How bad is it and what does it feel like? (e.g., crampy, dull, intermittent, constant)      Yes, mild   5. DIARRHEA: Is there any diarrhea? If Yes, ask: How many times today?      No   6. CONTACTS: Is there anyone else in the family with the same symptoms?      No   7. CAUSE: What do you think is causing your vomiting?     Took some else's prescription medication   8. HYDRATION STATUS: Any signs of dehydration? (e.g., dry mouth [not only dry lips], too weak to stand) When did you last urinate?      Dizziness, feels like  she will pass out   9. OTHER SYMPTOMS: Do you have any other symptoms? (e.g., fever, headache, vertigo, vomiting blood or coffee grounds, recent head injury)     Heart pounding   10. PREGNANCY: Is there any chance you are pregnant? When was your last menstrual period?       No    Patient states she took a Wegovy shot, 2.4 mg that she got from someone and she's been throwing up for two days. Patient is not established with [facility], calling on Community line. Referred to ED for symptoms.  Protocols used: Vomiting-A-AH

## 2024-05-27 NOTE — ED Triage Notes (Signed)
 Pt states that she took [facility] yesterday for the first time and she is having nausea, vomiting and she feels like her heart is beating fast.  Pt is tearful in triage.  Pt states that the symptoms come in waves. Pt took a dose of wegovey from her friend.

## 2024-05-27 NOTE — ED Provider Triage Note (Signed)
 Emergency Medicine Provider Triage Evaluation Note  Martha Johnson , a 33 y.o. female  was evaluated in triage.  Pt complains of chest pain, abdominal pain and vomiting.  Yesterday took a friend's dose of Wegovy, 2.4 mg.  Since then has had persistent NBNB emesis as well as upper abdominal pain and chest pain.  Feels like her heart will occasionally race.  Was not having symptoms prior to using the medication.  She has never used a GLP-1 previously.  Review of Systems  Positive: Cp, abd pain, emesis Negative: Sob, diarrhea  Physical Exam  BP (!) 134/92 (BP Location: Left Arm)   Pulse 84   Temp (!) 97.4 F (36.3 C)   Resp 19   LMP 05/06/2024   SpO2 99%   Breastfeeding No  Gen:   Awake, no distress   Resp:  Normal effort  Chest:  Tenderness diffuse anterior chest wall without crepitus or step-off Abd:  Diffuse tenderness abdomen MSK:   Moves extremities without difficulty  Other:    Medical Decision Making  Medically screening exam initiated at 12:29 PM.  Appropriate orders placed.  Martha Johnson was informed that the remainder of the evaluation will be completed by another provider, this initial triage assessment does not replace that evaluation, and the importance of remaining in the ED until their evaluation is complete.  Abd pain, emesis, cp   Raynah Gomes A, PA-C 05/27/24 1230

## 2024-05-27 NOTE — ED Triage Notes (Signed)
 Patient took 2.4 of Wegovey yesterday for the 1st time yesterday and now she is having chest pain, nausea and vomiting.  Patient reports she has waves of tremoring and shaking.  She states she is having abdominal pain also.

## 2024-05-28 ENCOUNTER — Encounter (HOSPITAL_COMMUNITY): Payer: Self-pay

## 2024-05-28 ENCOUNTER — Emergency Department (HOSPITAL_COMMUNITY)

## 2024-05-28 ENCOUNTER — Emergency Department (HOSPITAL_COMMUNITY)
Admission: EM | Admit: 2024-05-28 | Discharge: 2024-05-28 | Disposition: A | Discharge status: Home / Self Care | Attending: Emergency Medicine | Admitting: Emergency Medicine

## 2024-05-28 ENCOUNTER — Other Ambulatory Visit: Payer: Self-pay

## 2024-05-28 DIAGNOSIS — D72829 Elevated white blood cell count, unspecified: Secondary | ICD-10-CM | POA: Diagnosis not present

## 2024-05-28 DIAGNOSIS — R1032 Left lower quadrant pain: Secondary | ICD-10-CM | POA: Insufficient documentation

## 2024-05-28 DIAGNOSIS — S0990XA Unspecified injury of head, initial encounter: Secondary | ICD-10-CM | POA: Diagnosis not present

## 2024-05-28 DIAGNOSIS — R1031 Right lower quadrant pain: Secondary | ICD-10-CM | POA: Diagnosis not present

## 2024-05-28 DIAGNOSIS — R112 Nausea with vomiting, unspecified: Secondary | ICD-10-CM | POA: Diagnosis not present

## 2024-05-28 DIAGNOSIS — R1084 Generalized abdominal pain: Secondary | ICD-10-CM | POA: Diagnosis not present

## 2024-05-28 DIAGNOSIS — K5900 Constipation, unspecified: Secondary | ICD-10-CM | POA: Diagnosis not present

## 2024-05-28 DIAGNOSIS — R000 Tachycardia, unspecified: Secondary | ICD-10-CM | POA: Diagnosis not present

## 2024-05-28 DIAGNOSIS — R1030 Lower abdominal pain, unspecified: Secondary | ICD-10-CM

## 2024-05-28 LAB — URINALYSIS, ROUTINE W REFLEX MICROSCOPIC
Bacteria, UA: NONE SEEN
Bilirubin Urine: NEGATIVE
Glucose, UA: NEGATIVE mg/dL
Ketones, ur: 80 mg/dL — AB
Leukocytes,Ua: NEGATIVE
Nitrite: NEGATIVE
Protein, ur: 30 mg/dL — AB
Specific Gravity, Urine: >1.046 — ABNORMAL HIGH (ref 1.005–1.030)
pH: 5 (ref 5.0–8.0)

## 2024-05-28 LAB — CBC
HCT: 45.1 % (ref 36.0–46.0)
Hemoglobin: 15.4 g/dL — ABNORMAL HIGH (ref 12.0–15.0)
MCH: 32.6 pg (ref 26.0–34.0)
MCHC: 34.1 g/dL (ref 30.0–36.0)
MCV: 95.6 fL (ref 80.0–100.0)
Platelets: 408 K/uL — ABNORMAL HIGH (ref 150–400)
RBC: 4.72 MIL/uL (ref 3.87–5.11)
RDW: 13.4 % (ref 11.5–15.5)
WBC: 12.4 K/uL — ABNORMAL HIGH (ref 4.0–10.5)
nRBC: 0 % (ref 0.0–0.2)

## 2024-05-28 LAB — LIPASE, BLOOD: Lipase: 22 U/L (ref 11–51)

## 2024-05-28 LAB — COMPREHENSIVE METABOLIC PANEL WITH GFR
ALT: 12 U/L (ref 0–44)
AST: 15 U/L (ref 15–41)
Albumin: 4.8 g/dL (ref 3.5–5.0)
Alkaline Phosphatase: 56 U/L (ref 38–126)
Anion gap: 15 (ref 5–15)
BUN: 13 mg/dL (ref 6–20)
CO2: 15 mmol/L — ABNORMAL LOW (ref 22–32)
Calcium: 9.5 mg/dL (ref 8.9–10.3)
Chloride: 109 mmol/L (ref 98–111)
Creatinine, Ser: 0.63 mg/dL (ref 0.44–1.00)
GFR, Estimated: >60 mL/min (ref >60)
Glucose, Bld: 104 mg/dL — ABNORMAL HIGH (ref 70–99)
Potassium: 3.6 mmol/L (ref 3.5–5.1)
Sodium: 139 mmol/L (ref 135–145)
Total Bilirubin: 1.6 mg/dL — ABNORMAL HIGH (ref 0.0–1.2)
Total Protein: 8.8 g/dL — ABNORMAL HIGH (ref 6.5–8.1)

## 2024-05-28 LAB — RAPID URINE DRUG SCREEN, HOSP PERFORMED
Amphetamines: NOT DETECTED
Barbiturates: NOT DETECTED
Benzodiazepines: NOT DETECTED
Cocaine: NOT DETECTED
Opiates: NOT DETECTED
Tetrahydrocannabinol: POSITIVE — AB

## 2024-05-28 LAB — HCG, SERUM, QUALITATIVE: Preg, Serum: NEGATIVE

## 2024-05-28 LAB — TROPONIN I (HIGH SENSITIVITY): Troponin I (High Sensitivity): 3 ng/L (ref <18)

## 2024-05-28 MED ORDER — ONDANSETRON 4 MG PO TBDP: 4.0000 mg | ORAL_TABLET | Freq: Once | ORAL | Status: DC

## 2024-05-28 MED ORDER — IOHEXOL 350 MG/ML SOLN
75.0000 mL | Freq: Once | INTRAVENOUS | Status: AC | PRN
  Administered 2024-05-28: 75 mL via INTRAVENOUS

## 2024-05-28 MED ORDER — ONDANSETRON 4 MG PO TBDP
4.0000 mg | ORAL_TABLET | Freq: Three times a day (TID) | ORAL | 0 refills | Status: DC | PRN
Start: 2024-05-28 — End: 2024-06-05

## 2024-05-28 MED ORDER — DICYCLOMINE HCL 20 MG PO TABS: 20.0000 mg | ORAL_TABLET | Freq: Two times a day (BID) | ORAL | 0 refills | Status: AC

## 2024-05-28 MED ORDER — SODIUM CHLORIDE 0.9 % IV BOLUS
1000.0000 mL | Freq: Once | INTRAVENOUS | Status: AC
  Administered 2024-05-28: 1000 mL via INTRAVENOUS

## 2024-05-28 MED ORDER — ONDANSETRON HCL 4 MG/2ML IJ SOLN
4.0000 mg | Freq: Once | INTRAMUSCULAR | Status: AC
  Administered 2024-05-28: 4 mg via INTRAVENOUS
  Filled 2024-05-28: qty 2

## 2024-05-28 MED ORDER — AMOXICILLIN-POT CLAVULANATE 875-125 MG PO TABS: 1.0000 | ORAL_TABLET | Freq: Two times a day (BID) | ORAL | 0 refills | Status: DC

## 2024-05-28 MED ORDER — ONDANSETRON 4 MG PO TBDP
4.0000 mg | ORAL_TABLET | Freq: Once | ORAL | Status: AC | PRN
  Administered 2024-05-28: 4 mg via ORAL
  Filled 2024-05-28: qty 1

## 2024-05-28 MED ORDER — DICYCLOMINE HCL 10 MG PO CAPS
10.0000 mg | ORAL_CAPSULE | Freq: Once | ORAL | Status: AC
  Administered 2024-05-28: 10 mg via ORAL
  Filled 2024-05-28: qty 1

## 2024-05-28 NOTE — ED Notes (Addendum)
 PT prefers to wait for her urine lab results at this time before being discharged

## 2024-05-28 NOTE — ED Triage Notes (Addendum)
 Here by POV from home for abd pain, NV, and constipation. Onset 3d ago. 1st Wegovy shot 3d ago. Last BM 4-5d ago. Alert, calm, crying, interactive, resps e/u, speaking in clear complete sentences. Steady gait.

## 2024-05-28 NOTE — Discharge Instructions (Addendum)
 Thank you for letting us  evaluate you today.  Your CT imaging shows concern for infection of your colon versus irritable bowel syndrome.  I have given you an antibiotic for infection coverage.  This does contain amoxicillin, as discussed, if you have any shortness of breath, throat closing sensation, hives, itchiness to your skin, stop taking antibiotic/Augmentin and go to your nearest emergency department.  Otherwise, follow-up with [facility] in 1 week.  They have scheduled an appointment for you on 11/19 at 8:40a.  You may call them to confirm appointment date and time  Return to ED if you experience allergic reaction symptoms, intractable vomiting, worsening symptoms

## 2024-05-28 NOTE — ED Notes (Signed)
 Pt vomited in floor, asking for water, informed pt she should not drink anything since she is nauseated. Pt ambulatory to restroom, water heard running from faucet. Pt gait steady, NAD noted during ambulation.

## 2024-05-28 NOTE — ED Provider Notes (Signed)
 Received pt at sign out from previous provider pending GI consult, UA, UDA, PO challenge.  In short, pt presents to ED for evaluation of generalized abd pain, NV, constipation. Took friend's Wegovy 3 days ago for first time  Labs notable for mild leukocytosis of 12.4, T bili 1.6, hcg negative. CT imaging notable for Diffuse, long segment colon wall thickening and fatty mural stratification, particularly conspicuous in the ascending colon and transverse colon. Findings are consistent with nonspecific infectious or inflammatory colitis and chronic sequelae thereof, particularly including inflammatory bowel disease. No signs of obstruction.  Was provided IVF, bentyl, zofran  for abd pain, hydration, NV. No NV while in ED  Consulted [facility] Harlene Mail PA who reviewed ED workup, imaging, disposition.  She recommends starting Augmentin to cover for infectious colitis, Bentyl for abdominal cramping, and to follow-up next week in office on scheduled appointment on 06/05/2024 at 8:40a.   Patient does have allergy listed in her chart for penicillin and states childhood reaction.  She is unsure what happened when she was a child when she had penicillin and reports that her mother stated that she just cannot have it anymore.  Since then, she has had multiple doses of amoxicillin as an adult with no reaction.  Her last amoxicillin dose was last year for a URI with no reaction.  I did discuss allergic reaction, anaphylaxis precautions and return precautions with her and provided these on DC paperwork.  She expressed understanding  Discussed ED workup, disposition, return to ED precautions with patient who expresses understanding agrees with plan.  All questions answered to their satisfaction.  They are agreeable to plan.  Discharge instructions provided on paperwork   Martha Tinnie BRAVO, PA 05/28/24 1552    Martha Lamar BROCKS, MD 05/29/24 1054

## 2024-05-28 NOTE — ED Provider Triage Note (Signed)
 Emergency Medicine Provider Triage Evaluation Note  Martha Johnson , a 33 y.o. female  was evaluated in triage.  Pt complains of generalized abdominal pain, nausea, vomiting, and constipation since taking a Wegovy shot 3 days ago. Patient states that the shot was not prescribed to her. Since then has had excessive vomiting but denies hematemesis. Patient ambulatory without assistance.  Review of Systems  Positive: Abdominal pain, nausea, vomiting Negative: Urinary symptoms, fever, chills, chest pain, shortness of breath  Physical Exam  BP (!) 161/91   Pulse (!) 101   Temp (!) 97.4 F (36.3 C)   Resp 19   Ht 5' 9 (1.753 m)   Wt 109 kg   LMP 05/06/2024   SpO2 100%   BMI 35.49 kg/m  Gen:   Awake, no distress   Resp:  Normal effort  MSK:   Moves extremities without difficulty  Other:  Generalized abdominal tenderness with palpation, abdomen soft and non-distended  Medical Decision Making  Medically screening exam initiated at 12:45 PM.  Appropriate orders placed.  Gavin Lindh was informed that the remainder of the evaluation will be completed by another provider, this initial triage assessment does not replace that evaluation, and the importance of remaining in the ED until their evaluation is complete.  Orders: CBC, CMP, UA, Lipase, CT abdomen pelvis with contrast, zofran , pregnancy test   Janetta Terrall FALCON, PA-C 05/28/24 1248

## 2024-05-28 NOTE — ED Notes (Signed)
 Patient transported to CT

## 2024-05-28 NOTE — ED Provider Notes (Signed)
 [place] EMERGENCY DEPARTMENT AT [facility] Provider Note   CSN: [id number] Arrival date & time: 05/28/24  1012     Patient presents with: Abdominal Pain   Martha Johnson is a 33 y.o. female with past medical history of marijuana use, hyperthyroidism, on Wegovy and most recent shot 5 days ago, who presents to the emergency department for evaluation of nausea vomiting and constipation.  Patient reports she has had multiple episodes of green emesis with no hematemesis.  She reports her last bowel movement was 3 days ago and she has not passed flatus since that time.  She has significant lower abdominal tenderness to palpation.  Prior abdominal surgeries include C-section.  She states she is unable to eat or drink anything and is requesting IV fluids.  Per triage note, patient was found to be lying on the floor next to her wheelchair.  Unsure if there was a fall or syncope.  Patient was calm and cooperative to assist getting back into the chair.  Abdominal Pain      Prior to Admission medications   Medication Sig Start Date End Date Taking? Authorizing Provider  acetaminophen  (TYLENOL ) 500 MG tablet Take 1,000 mg by mouth every 6 (six) hours as needed for mild pain, fever or headache.    [provider]  ibuprofen  (ADVIL ) 600 MG tablet Take 1 tablet (600 mg total) by mouth every 6 (six) hours. 02/28/21   Smith, [place] , CNM  oxyCODONE  (OXY IR/ROXICODONE ) 5 MG immediate release tablet Take 1 tablet (5 mg total) by mouth every 4 (four) hours as needed for breakthrough pain (pain scale 4-7). 02/28/21   Smith, Virginia , CNM  polyethylene glycol powder (GLYCOLAX /MIRALAX ) 17 GM/SCOOP powder Take 17 g by mouth daily as needed for moderate constipation. 02/28/21   Smith, Virginia , CNM  Prenatal Vit-Fe Fumarate-FA (PRENATAL MULTIVITAMIN) TABS tablet Take 1 tablet by mouth daily at 12 noon.    [provider]    Allergies: Penicillins    Review of Systems   Gastrointestinal:  Positive for abdominal pain.    Updated Vital Signs BP (!) 161/91   Pulse (!) 101   Temp 97.8 F (36.6 C) (Oral)   Resp 19   Ht 5' 9 (1.753 m)   Wt 109 kg   LMP 05/06/2024   SpO2 100%   BMI 35.49 kg/m   Physical Exam Vitals and nursing note reviewed.  Constitutional:      General: She is in acute distress.     Appearance: Normal appearance.  HENT:     Head: Normocephalic and atraumatic.     Mouth/Throat:     Mouth: Mucous membranes are moist.  Eyes:     General: No scleral icterus.       Right eye: No discharge.        Left eye: No discharge.     Conjunctiva/sclera: Conjunctivae normal.  Cardiovascular:     Rate and Rhythm: Normal rate and regular rhythm.     Pulses: Normal pulses.  Pulmonary:     Effort: Pulmonary effort is normal.     Breath sounds: Normal breath sounds.  Abdominal:     General: There is no distension.     Palpations: Abdomen is soft.     Tenderness: There is abdominal tenderness in the right lower quadrant and left lower quadrant.     Comments: Significant right and lower quadrant abdominal tenderness to palpation.  Abdomen soft and nondistended otherwise.  Negative Murphy's.  Negative Rovsing.  No guarding.  Musculoskeletal:        General: No deformity.     Cervical back: Normal range of motion.  Skin:    General: Skin is warm and dry.     Capillary Refill: Capillary refill takes less than 2 seconds.  Neurological:     Mental Status: She is alert.     Motor: No weakness.  Psychiatric:        Mood and Affect: Mood normal.     (all labs ordered are listed, but only abnormal results are displayed) Labs Reviewed  COMPREHENSIVE METABOLIC PANEL WITH GFR - Abnormal; Notable for the following components:      Result Value   CO2 15 (*)    Glucose, Bld 104 (*)    Total Protein 8.8 (*)    Total Bilirubin 1.6 (*)    All other components within normal limits  CBC - Abnormal; Notable for the following components:   WBC  12.4 (*)    Hemoglobin 15.4 (*)    Platelets 408 (*)    All other components within normal limits  LIPASE, BLOOD  HCG, SERUM, QUALITATIVE  URINALYSIS, ROUTINE W REFLEX MICROSCOPIC  RAPID URINE DRUG SCREEN, HOSP PERFORMED    EKG: EKG Interpretation Date/Time:  Tuesday May 28 2024 13:16:02 EST Ventricular Rate:  92 PR Interval:  164 QRS Duration:  98 QT Interval:  390 QTC Calculation: 482 R Axis:   82  Text Interpretation: Normal sinus rhythm Prolonged QT Abnormal ECG When compared with ECG of 27-May-2024 12:38, PREVIOUS ECG IS PRESENT Confirmed by Ruthe Cornet [phone]) on 05/28/2024 2:39:34 PM  Radiology: CT Head Wo Contrast Result Date: 05/28/2024 CLINICAL DATA:  Trauma EXAM: CT HEAD WITHOUT CONTRAST TECHNIQUE: Contiguous axial images were obtained from the base of the skull through the vertex without intravenous contrast. RADIATION DOSE REDUCTION: This exam was performed according to the departmental dose-optimization program which includes automated exposure control, adjustment of the mA and/or kV according to patient size and/or use of iterative reconstruction technique. COMPARISON:  None Available. FINDINGS: Brain: No evidence of acute infarction, hemorrhage, hydrocephalus, extra-axial collection or mass lesion/mass effect. Vascular: No hyperdense vessel or unexpected calcification. Skull: Normal. Negative for fracture or focal lesion. Sinuses/Orbits: No acute finding. Other: None. IMPRESSION: No acute intracranial pathology. Electronically Signed   By: Marolyn JONETTA Jaksch M.D.   On: 05/28/2024 13:41   CT ABDOMEN PELVIS W CONTRAST Result Date: 05/28/2024 CLINICAL DATA:  Generalized abdominal pain, constipation, nausea and vomiting EXAM: CT ABDOMEN AND PELVIS WITH CONTRAST TECHNIQUE: Multidetector CT imaging of the abdomen and pelvis was performed using the standard protocol following bolus administration of intravenous contrast. RADIATION DOSE REDUCTION: This exam was performed  according to the departmental dose-optimization program which includes automated exposure control, adjustment of the mA and/or kV according to patient size and/or use of iterative reconstruction technique. CONTRAST:  75mL OMNIPAQUE IOHEXOL 350 MG/ML SOLN COMPARISON:  None Available. FINDINGS: Lower chest: No acute abnormality. Hepatobiliary: No solid liver abnormality is seen. No gallstones, gallbladder wall thickening, or biliary dilatation. Pancreas: Unremarkable. No pancreatic ductal dilatation or surrounding inflammatory changes. Spleen: Normal in size without significant abnormality. Adrenals/Urinary Tract: Adrenal glands are unremarkable. Kidneys are normal, without renal calculi, solid lesion, or hydronephrosis. Bladder is unremarkable. Stomach/Bowel: Stomach is within normal limits. Appendix appears normal. Diffuse, long segment colon wall thickening and fatty mural stratification, particularly conspicuous in the ascending colon and transverse colon (series 6, image 42, series 2, image 37). No evidence of obstruction. Vascular/Lymphatic: No significant vascular findings are  present. No enlarged abdominal or pelvic lymph nodes. Reproductive: No mass or other significant abnormality. Small functional ovarian follicles. Other: No abdominal wall hernia or abnormality. No ascites. Musculoskeletal: No acute or significant osseous findings. IMPRESSION: 1. Diffuse, long segment colon wall thickening and fatty mural stratification, particularly conspicuous in the ascending colon and transverse colon. Findings are consistent with nonspecific infectious or inflammatory colitis and chronic sequelae thereof, particularly including inflammatory bowel disease given this appearance. 2. No evidence of obstruction. Electronically Signed   By: Marolyn JONETTA Jaksch M.D.   On: 05/28/2024 13:38    Procedures   Medications Ordered in the ED  dicyclomine (BENTYL) capsule 10 mg (has no administration in time range)  ondansetron   (ZOFRAN -ODT) disintegrating tablet 4 mg (4 mg Oral Given 05/28/24 1028)  ondansetron  (ZOFRAN ) injection 4 mg (4 mg Intravenous Given 05/28/24 1309)  sodium chloride  0.9 % bolus 1,000 mL (1,000 mLs Intravenous New Bag/Given 05/28/24 1310)  iohexol (OMNIPAQUE) 350 MG/ML injection 75 mL (75 mLs Intravenous Contrast Given 05/28/24 1326)                                Medical Decision Making Amount and/or Complexity of Data Reviewed Labs: ordered. Radiology: ordered.  Risk Prescription drug management.   This patient presents to the ED for concern of nausea vomiting constipation, this involves an extensive number of treatment options, and is a complaint that carries with it a high risk of complications and morbidity.  Differential diagnosis includes: SBO, mesenteric ischemia, constipation, gastroparesis, cholecystitis, appendicitis, cannabinoid hyperemesis syndrome  Co morbidities:  known marijuana use  Social Determinants of Health:   Medicaid insurance  Additional history:  Patient was evaluated in triage yesterday for complaints of chest pain, abdominal pain and vomiting.  2 days ago, she took a friend's dose of Wegovy, 2.4 mg and her symptoms began then.  She was not having symptoms prior to administering this medication.  However, she left without being seen.  Lab Tests:  I Ordered, and personally interpreted labs.  The pertinent results include:    - WBC: 12.4  Imaging Studies:  I ordered imaging studies including CT head and CT abdomen pelvis I independently visualized and interpreted imaging which showed   CT Abdomen 1. Diffuse, long segment colon wall thickening and fatty mural  stratification, particularly conspicuous in the ascending colon and  transverse colon. Findings are consistent with nonspecific  infectious or inflammatory colitis and chronic sequelae thereof,  particularly including inflammatory bowel disease given this  appearance.  2. No evidence of  obstruction.   CT Head No acute intracranial abnormality apology  I agree with the radiologist interpretation  Cardiac Monitoring/ECG:  The patient was maintained on a cardiac monitor.  I personally viewed and interpreted the cardiac monitored which showed an underlying rhythm of: Sinus tachycardia  Medicines ordered and prescription drug management:  I ordered medication including  Medications  dicyclomine (BENTYL) capsule 10 mg (has no administration in time range)  ondansetron  (ZOFRAN -ODT) disintegrating tablet 4 mg (4 mg Oral Given 05/28/24 1028)  ondansetron  (ZOFRAN ) injection 4 mg (4 mg Intravenous Given 05/28/24 1309)  sodium chloride  0.9 % bolus 1,000 mL (1,000 mLs Intravenous New Bag/Given 05/28/24 1310)  iohexol (OMNIPAQUE) 350 MG/ML injection 75 mL (75 mLs Intravenous Contrast Given 05/28/24 1326)   for vomiting and dehydration Reevaluation of the patient after these medicines showed that the patient improved I have reviewed the patients home medicines and have  made adjustments as needed  Test Considered:   none  Critical Interventions:   none  Consultations Obtained: - gastroenterology  Problem List / ED Course:     ICD-10-CM   1. Lower abdominal pain  R10.30     2. Nausea and vomiting, unspecified vomiting type  R11.2       MDM: 33 year old female who presents to the emergency department for evaluation of nausea, vomiting, constipation.  Patient reportedly took her friend's dose of Wegovy 2-3 days ago and that is when her symptoms began.  She has had multiple episodes of emesis since that time without hematemesis.  She has not had a bowel movement in 4-5 days and has not passed any flatus in the last 3 days.  Patient has mild leukocytosis of 12.4.  She does appear to be in acute distress and is significantly tender on the right and left lower quadrant of her abdomen.  Only prior abdominal surgery she reported was a C-section.  Most recent episode of emesis  was while in the waiting room.  She reports not being able to eat or drink anything in the last 3 days.  I ordered IV fluids and IV Zofran .  I have a strong suspicion for SBO, so CT abdomen and pelvis ordered.  Additionally, since she was found laying next to her wheelchair while in the waiting room, I have added a CT head.  Upon reassessment, patient does report some mild improvement in her nausea.  CT head shows no acute intracranial pathology or abnormality.  Patient's CT abdomen shows no evidence of obstruction.  However findings are consistent with nonspecific infectious or inflammatory colitis particularly including inflammable bowel disease.  Consult to gastroenterology placed.  Will defer to their recommendation on whether patient needs to be admitted for colonoscopy and EGD or if this can be handled outpatient.  Patient does continue to endorse abdominal pain so I ordered Bentyl for symptomatic relief.  Currently pending GI recs. Patient handoff given to Baron, PA-C.  Dispostion:  After consideration of the diagnostic results and the patients response to treatment, I feel that the patient would benefit from possible hospital admission for pain control pending GI recs.   Final diagnoses:  Lower abdominal pain  Nausea and vomiting, unspecified vomiting type    ED Discharge Orders     None          Torrence Marry RAMAN, PA-C 05/28/24 1514    Ruthe Cornet, DO 05/29/24 1509

## 2024-05-28 NOTE — ED Notes (Addendum)
 Called to w/r b/r. Pt outside b/r lying on floor next to w/c. Unsure of fall. Unsure of syncope. Pt cooperative and able to assist getting back into chair, able to bear weight. Taken back to triage for reassessment and VS. Pt continues to c/o constipation and is worried about SBO.   Pt 's visitor asked to leave, security to assist.

## 2024-06-04 NOTE — Progress Notes (Unsigned)
 06/05/2024 Martha Johnson [id number] Feb 22, 1991  Referring provider: No ref. provider found Primary GI doctor: Dr. Charlanne  ASSESSMENT AND PLAN:  Abnormal CT AP W 05/28/2024 concerning for IBD Has had issues since birth of her daughter 3 years ago Has lower AB pain better after BM, constipation then soft stools, 2 x a week, rare BRB in stool Some nausea/vomiting until she has BM Weight loss since daughter, was on ozempic for 2 months, has not had in a year, continuing weight loss No family history of IBD, colon cancer IBS-C, pelvic floor, rule out IBD with abnormal CT  -CRP/ESR to rule out inflammation, consider fecal calprotectin if elevated - TSH  -TTG/IGA to evaluate for celiac disease.  -Add on citracel/benefiber, FODMAP, trial off lactulose and lifestyle changes discussed - possible pelvic floor component, consider referral to PT, information given - add on miralax /fiber, consider linzess - schedule colonoscopy to rule out IBD with abnormal CT scan and symptoms, We have discussed the risks of bleeding, infection, perforation, medication reactions, and remote risk of death associated with colonoscopy. All questions were answered and the patient acknowledges these risk and wishes to proceed.  GERD x 3 years No melena ,no dysphagia ETOH 3 days a week, no NSAIDS, uses Marijuana use - check celiac, H pylori diatherix -start on pepcid  40 mg -Lifestyle changes discussed, avoid NSAIDS, ETOH, hand out given to the patient  Urinary hesitancy and incomplete emptying Likely related to pelvic floor dysfunction post-childbirth. - Provided information on pelvic floor dysfunction and potential pelvic floor physical therapy.  Marijuana use Instructed to quit smoking marijuana  Handout given to the patient   Patient Care Team: Patient, No Pcp Per as PCP - General (General Practice)  HISTORY OF PRESENT ILLNESS: 33 y.o. female with a past medical history listed below presents for  evaluation of abnormal stools, abnormal CT colon.   Acute ER visit 05/28/2024 for abdominal pain nausea vomiting and constipation 3 days after taking Wegovy 2.4mg  for the first time.  Had leukocytosis no anemia, CT diffuse long segment colon wall thickening and fatty mural stratification conspicuous ascending colon and transverse colon consistent with nonspecific infectious versus inflammatory colitis no obstruction. Patient given Augmentin and Bentyl here for follow-up.  Discussed the use of AI scribe software for clinical note transcription with the patient, who gave verbal consent to proceed.  History of Present Illness   Martha Johnson is a 33 year old female who presents with abdominal pain, nausea, vomiting, and constipation after using Wegovy.  She has been experiencing abdominal pain, nausea, vomiting, and constipation for about two months, with a notable increase in severity after starting [facility]. She took a dose of 2.4 mg of Wegovy. Bowel movements are infrequent, occurring twice a week, whereas she used to occur every morning. The initial part of the stool is hard, followed by normal consistency. Occasionally, there is bright red blood in the stool, described as a 'little pink line.' Abdominal discomfort improves significantly after a bowel movement.  She has a history of constipation and lower abdominal discomfort since the birth of her daughter, who is almost three years old. She suspects she has internal hemorrhoids. She experiences difficulty with bowel movements, sometimes needing to 'rock a little bit' to facilitate passage.  She reports significant weight loss from 235 lbs to 180 lbs since her daughter's birth, partially attributed to the use of Ozempic, which she stopped nearly a year ago. No further weight loss since discontinuing Ozempic.  She experiences joint  pain from her right ankle to her hip, sometimes extending to her neck, which she attributes to aging. She also reports  heartburn for the past three years, which she describes as 'bad.'  No family history of autoimmune conditions, cancer, or other significant illnesses. History of hyperthyroidism treated with medication, discontinued due to cost.  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 She denies regular use of over-the-counter pain medications. She experienced fevers and chills during her recent ER visit but not since then.  She reports urinary hesitancy and dribbling, which she associates with pelvic floor changes post-childbirth. She also experiences lower back pain on the right side, which does not improve with rest.        She  reports that she has never smoked. She has never used smokeless tobacco. She reports that she does not drink alcohol and does not use drugs.  RELEVANT GI HISTORY, IMAGING AND LABS: Results   RADIOLOGY Abdominal CT: Segment of wall thickening in the colon with fat infiltration in the ascending and transverse colon (05/28/2024)  DIAGNOSTIC Rectal Exam: Internal hemorrhoids, no external hemorrhoids, negative for blood (06/05/2024)     05/28/2024 CTAP W IMPRESSION: 1. Diffuse, long segment colon wall thickening and fatty mural stratification, particularly conspicuous in the ascending colon and transverse colon. Findings are consistent with nonspecific infectious or inflammatory colitis and chronic sequelae thereof, particularly including inflammatory bowel disease given this appearance. 2. No evidence of obstruction.    CBC    Component Value Date/Time   WBC 12.4 (H) 05/28/2024 1037   RBC 4.72 05/28/2024 1037   HGB 15.4 (H) 05/28/2024 1037   HCT 45.1 05/28/2024 1037   PLT 408 (H) 05/28/2024 1037   MCV 95.6 05/28/2024 1037   MCH 32.6 05/28/2024 1037   MCHC 34.1 05/28/2024 1037   RDW 13.4 05/28/2024 1037   LYMPHSABS 1.9 05/27/2024 1300   MONOABS 0.4 05/27/2024 1300   EOSABS 0.0 05/27/2024 1300   BASOSABS 0.0  05/27/2024 1300   Recent Labs    05/27/24 1300 05/28/24 1037  HGB 15.3* 15.4*    CMP     Component Value Date/Time   NA 139 05/28/2024 1037   K 3.6 05/28/2024 1037   CL 109 05/28/2024 1037   CO2 15 (L) 05/28/2024 1037   GLUCOSE 104 (H) 05/28/2024 1037   BUN 13 05/28/2024 1037   CREATININE 0.63 05/28/2024 1037   CALCIUM  9.5 05/28/2024 1037   PROT 8.8 (H) 05/28/2024 1037   ALBUMIN 4.8 05/28/2024 1037   AST 15 05/28/2024 1037   ALT 12 05/28/2024 1037   ALKPHOS 56 05/28/2024 1037   BILITOT 1.6 (H) 05/28/2024 1037   GFRNONAA >60 05/28/2024 1037   GFRAA >90 11/06/2012 1119      Latest Ref Rng & Units 05/28/2024   10:37 AM 05/27/2024    1:00 PM 07/07/2020    7:46 PM  Hepatic Function  Total Protein 6.5 - 8.1 g/dL 8.8  8.7  7.4   Albumin 3.5 - 5.0 g/dL 4.8  4.6  4.0   AST 15 - 41 U/L 15  18  19    ALT 0 - 44 U/L 12  13  16    Alk Phosphatase 38 - 126 U/L 56  57  53   Total Bilirubin 0.0 - 1.2 mg/dL 1.6  1.4  0.7       Current Medications:      Current Outpatient Medications (Analgesics):  acetaminophen  (TYLENOL ) 500 MG tablet, Take 1,000 mg by mouth every 6 (six) hours as needed for mild pain, fever or headache.   Current Outpatient Medications (Other):    dicyclomine (BENTYL) 20 MG tablet, Take 1 tablet (20 mg total) by mouth 2 (two) times daily.   famotidine  (PEPCID ) 40 MG tablet, Take 1 tablet (40 mg total) by mouth daily.   Na Sulfate-K Sulfate-Mg Sulfate concentrate (SUPREP) 17.5-3.13-1.6 GM/177ML SOLN, Take 1 kit (354 mLs total) by mouth once for 1 dose.  Medical History:  Past Medical History:  Diagnosis Date   Hyperthyroidism    UTI (urinary tract infection)    Vaginal Pap smear, abnormal    Allergies:  Allergies  Allergen Reactions   Penicillins Other (See Comments)    Childhood reaction     Surgical History:  She  has a past surgical history that includes LEEP; Dilation and curettage of uterus; and Tubal ligation (Bilateral,  02/27/2021). Family History:  Her family history includes Healthy in her mother; Hypertension in her father; Kidney disease in her father.  REVIEW OF SYSTEMS  : All other systems reviewed and negative except where noted in the History of Present Illness.  PHYSICAL EXAM: BP 118/76   Pulse 69   Ht 5' 9 (1.753 m)   Wt 183 lb (83 kg)   LMP 05/06/2024   BMI 27.02 kg/m  Physical Exam   GENERAL APPEARANCE: Well nourished, in no apparent distress. HEENT: No cervical lymphadenopathy, unremarkable thyroid, sclerae anicteric, conjunctiva pink. RESPIRATORY: Respiratory effort normal, breath sounds equal bilaterally without rales, rhonchi, or wheezing. CARDIO: Regular rate and rhythm with no murmurs, rubs, or gallops, peripheral pulses intact. ABDOMEN: Soft, non-distended, active bowel sounds in all four quadrants, tenderness in the left lower quadrant, no rebound, no mass appreciated. RECTAL: No external hemorrhoids, possible internal hemorrhoids, no masses, negative for blood. MUSCULOSKELETAL: Full range of motion, normal gait, without edema. SKIN: Dry, intact without rashes or lesions. No jaundice. NEURO: Alert, oriented, no focal deficits. PSYCH: Cooperative, normal mood and affect.      Alan JONELLE Coombs, PA-C 10:18 AM

## 2024-06-05 ENCOUNTER — Ambulatory Visit: Admitting: Physician Assistant

## 2024-06-05 ENCOUNTER — Encounter: Payer: Self-pay | Admitting: Physician Assistant

## 2024-06-05 ENCOUNTER — Other Ambulatory Visit

## 2024-06-05 VITALS — BP 118/76 | HR 69 | Ht 69.0 in | Wt 183.0 lb

## 2024-06-05 DIAGNOSIS — R195 Other fecal abnormalities: Secondary | ICD-10-CM | POA: Diagnosis not present

## 2024-06-05 DIAGNOSIS — K219 Gastro-esophageal reflux disease without esophagitis: Secondary | ICD-10-CM

## 2024-06-05 DIAGNOSIS — R933 Abnormal findings on diagnostic imaging of other parts of digestive tract: Secondary | ICD-10-CM

## 2024-06-05 LAB — COMPREHENSIVE METABOLIC PANEL WITH GFR
ALT: 8 U/L (ref 0–35)
AST: 10 U/L (ref 0–37)
Albumin: 4.8 g/dL (ref 3.5–5.2)
Alkaline Phosphatase: 48 U/L (ref 39–117)
BUN: 7 mg/dL (ref 6–23)
CO2: 30 meq/L (ref 19–32)
Calcium: 9.7 mg/dL (ref 8.4–10.5)
Chloride: 100 meq/L (ref 96–112)
Creatinine, Ser: 0.64 mg/dL (ref 0.40–1.20)
GFR: 114.14 mL/min (ref >60.00)
Glucose, Bld: 91 mg/dL (ref 70–99)
Potassium: 3.9 meq/L (ref 3.5–5.1)
Sodium: 139 meq/L (ref 135–145)
Total Bilirubin: 0.5 mg/dL (ref 0.2–1.2)
Total Protein: 7.8 g/dL (ref 6.0–8.3)

## 2024-06-05 LAB — CBC WITH DIFFERENTIAL/PLATELET
Basophils Absolute: 0 K/uL (ref 0.0–0.1)
Basophils Relative: 0.6 % (ref 0.0–3.0)
Eosinophils Absolute: 0.1 K/uL (ref 0.0–0.7)
Eosinophils Relative: 1.1 % (ref 0.0–5.0)
HCT: 41 % (ref 36.0–46.0)
Hemoglobin: 13.9 g/dL (ref 12.0–15.0)
Lymphocytes Relative: 31.6 % (ref 12.0–46.0)
Lymphs Abs: 1.9 K/uL (ref 0.7–4.0)
MCHC: 33.9 g/dL (ref 30.0–36.0)
MCV: 96.7 fl (ref 78.0–100.0)
Monocytes Absolute: 0.5 K/uL (ref 0.1–1.0)
Monocytes Relative: 7.4 % (ref 3.0–12.0)
Neutro Abs: 3.6 K/uL (ref 1.4–7.7)
Neutrophils Relative %: 59.3 % (ref 43.0–77.0)
Platelets: 339 K/uL (ref 150.0–400.0)
RBC: 4.24 Mil/uL (ref 3.87–5.11)
RDW: 13.6 % (ref 11.5–15.5)
WBC: 6.1 K/uL (ref 4.0–10.5)

## 2024-06-05 LAB — TSH: TSH: 1.26 u[IU]/mL (ref 0.35–5.50)

## 2024-06-05 LAB — SEDIMENTATION RATE: Sed Rate: 8 mm/h (ref 0–20)

## 2024-06-05 LAB — C-REACTIVE PROTEIN: CRP: 0.8 mg/dL (ref 0.5–20.0)

## 2024-06-05 MED ORDER — NA SULFATE-K SULFATE-MG SULF 17.5-3.13-1.6 GM/177ML PO SOLN: 1.0000 | Freq: Once | ORAL | 0 refills | Status: AC

## 2024-06-05 MED ORDER — FAMOTIDINE 40 MG PO TABS: 40.0000 mg | ORAL_TABLET | Freq: Every day | ORAL | 3 refills | Status: AC

## 2024-06-05 NOTE — Patient Instructions (Addendum)
 Your provider has requested that you go to the basement level for lab work before leaving today. Press B on the elevator. The lab is located at the first door on the left as you exit the elevator.  Miralax  is an osmotic laxative.  It only brings more water into the stool.  This is safe to take daily.  Can take up to 17 gram of miralax  twice a day.  Mix with juice or coffee.  Start 1 capful at night for 3-4 days and reassess your response in 3-4 days.  You can increase and decrease the dose based on your response.  Remember, it can take up to 3-4 days to take effect OR for the effects to wear off.   I often pair this with benefiber in the morning to help assure the stool is not too loose.   FIBER SUPPLEMENT You can do metamucil or fibercon once or twice a day but if this causes gas/bloating please switch to Benefiber or Citracel.  Fiber is good for constipation/diarrhea/irritable bowel syndrome.  It can also help with weight loss and can help lower your bad cholesterol (LDL).  Please do 1 TBSP in the morning in water, coffee, or tea.  It can take up to a month before you can see a difference with your bowel movements.  It is cheapest from [employer], [employer], [employer].   Toileting tips to help with your constipation - Drink at least 64-80 ounces of water/liquid per day. - Establish a time to try to move your bowels every day.  For many people, this is after a cup of coffee or after a meal such as breakfast. - Sit all of the way back on the toilet keeping your back fairly straight and while sitting up, try to rest the tops of your forearms on your upper thighs.   - Raising your feet with a step stool/squatty potty can be helpful to improve the angle that allows your stool to pass through the rectum. - Relax the rectum feeling it bulge toward the toilet water.  If you feel your rectum raising toward your body, you are contracting rather than relaxing. - Breathe in and slowly exhale. Belly breath  by expanding your belly towards your belly button. Keep belly expanded as you gently direct pressure down and back to the anus.  A low pitched GRRR sound can assist with increasing intra-abdominal pressure.  (Can also trying to blow on a pinwheel and make it move, this helps with the same belly breathing) - Repeat 3-4 times. If unsuccessful, contract the pelvic floor to restore normal tone and get off the toilet.  Avoid excessive straining. - To reduce excessive wiping by teaching your anus to normally contract, place hands on outer aspect of knees and resist knee movement outward.  Hold 5-10 second then place hands just inside of knees and resist inward movement of knees.  Hold 5 seconds.  Repeat a few times each way.  Go to the ER if unable to pass gas, severe AB pain, unable to hold down food, any shortness of breath of chest pain.  Please take your proton pump inhibitor medication, pepcid  40 mg daily Avoid spicy and acidic foods Avoid fatty foods Limit your intake of coffee, tea, alcohol, and carbonated drinks Work to maintain a healthy weight Keep the head of the bed elevated at least 3 inches with blocks or a wedge pillow if you are having any nighttime symptoms Stay upright for 2 hours after eating Avoid meals  and snacks three to four hours before bedtime   Here some information about pelvic floor dysfunction. This may be contributing to some of your symptoms. We will continue with our evaluation but I do want you to consider adding on fiber supplement with low-dose MiraLAX  daily. We could also refer to pelvic floor physical therapy.   Pelvic Floor Dysfunction, Female Pelvic floor dysfunction (PFD) is a condition that results when the group of muscles and connective tissues that support the organs in the pelvis (pelvic floor muscles) do not work well. These muscles and their connections form a sling that supports the colon and bladder. In women, they also support the uterus. PFD  causes pelvic floor muscles to be too weak, too tight, or both. In PFD, muscle movements are not coordinated. This may cause bowel or bladder problems. It may also cause pain. What are the causes? This condition may be caused by an injury to the pelvic area or by a weakening of pelvic muscles. This often results from pregnancy and childbirth or other types of strain. In many cases, the exact cause is not known. What increases the risk? The following factors may make you more likely to develop this condition: Having chronic bladder tissue inflammation (interstitial cystitis). Being an older person. Being overweight. History of radiation treatment for cancer in the pelvic region. Previous pelvic surgery, such as removal of the uterus (hysterectomy). What are the signs or symptoms? Symptoms of this condition vary and may include: Bladder symptoms, such as: Trouble starting urination and emptying the bladder. Frequent urinary tract infections. Leaking urine when coughing, laughing, or exercising (stress incontinence). Having to pass urine urgently or frequently. Pain when passing urine. Bowel symptoms, such as: Constipation. Urgent or frequent bowel movements. Incomplete bowel movements. Painful bowel movements. Leaking stool or gas. Unexplained genital or rectal pain. Genital or rectal muscle spasms. Low back pain. Other symptoms may include: A heavy, full, or aching feeling in the vagina. A bulge that protrudes into the vagina. Pain during or after sex. How is this diagnosed? This condition may be diagnosed based on: Your symptoms and medical history. A physical exam. During the exam, your health care provider may check your pelvic muscles for tightness, spasm, pain, or weakness. This may include a rectal exam and a pelvic exam. In some cases, you may have diagnostic tests, such as: Electrical muscle function tests. Urine flow testing. X-ray tests of bowel function. Ultrasound of  the pelvic organs. How is this treated? Treatment for this condition depends on the symptoms. Treatment options include: Physical therapy. This may include Kegel exercises to help relax or strengthen the pelvic floor muscles. Biofeedback. This type of therapy provides feedback on how tight your pelvic floor muscles are so that you can learn to control them. Internal or external massage therapy. A treatment that involves electrical stimulation of the pelvic floor muscles to help control pain (transcutaneous electrical nerve stimulation, or TENS). Sound wave therapy (ultrasound) to reduce muscle spasms. Medicines, such as: Muscle relaxants. Bladder control medicines. Surgery to reconstruct or support pelvic floor muscles may be an option if other treatments do not help. Follow these instructions at home: Activity Do your usual activities as told by your health care provider. Ask your health care provider if you should modify any activities. Do pelvic floor strengthening or relaxing exercises at home as told by your physical therapist. Lifestyle Maintain a healthy weight. Eat foods that are high in fiber, such as beans, whole grains, and fresh fruits and  vegetables. Limit foods that are high in fat and processed sugars, such as fried or sweet foods. Manage stress with relaxation techniques such as yoga or meditation. General instructions If you have problems with leakage: Use absorbable pads or wear padded underwear. Wash frequently with mild soap. Keep your genital and anal area as clean and dry as possible. Ask your health care provider if you should try a barrier cream to prevent skin irritation. Take warm baths to relieve pelvic muscle tension or spasms. Take over-the-counter and prescription medicines only as told by your health care provider. Keep all follow-up visits. How is this prevented? The cause of PFD is not always known, but there are a few things you can do to reduce the  risk of developing this condition, including: Staying at a healthy weight. Getting regular exercise. Managing stress. Contact a health care provider if: Your symptoms are not improving with home care. You have signs or symptoms of PFD that get worse at home. You develop new signs or symptoms. You have signs of a urinary tract infection, such as: Fever. Chills. Increased urinary frequency. A burning feeling when urinating. You have not had a bowel movement in 3 days (constipation). Summary Pelvic floor dysfunction results when the muscles and connective tissues in your pelvic floor do not work well. These muscles and their connections form a sling that supports your colon and bladder. In women, they also support the uterus. PFD may be caused by an injury to the pelvic area or by a weakening of pelvic muscles. PFD causes pelvic floor muscles to be too weak, too tight, or a combination of both. Symptoms may vary from person to person. In most cases, PFD can be treated with physical therapies and medicines. Surgery may be an option if other treatments do not help. This information is not intended to replace advice given to you by your health care provider. Make sure you discuss any questions you have with your health care provider. Document Revised: 11/11/2020 Document Reviewed: 11/11/2020 Elsevier Patient Education  2022 [employer]  ___________________________  Cannabinoid Hyperemesis Syndrome  What is cannabinoid hyperemesis syndrome?  Cannabinoid hyperemesis syndrome (CHS) is a condition that leads to repeated and severe bouts of vomiting. It is rare and only occurs in daily long-term users of marijuana.  Marijuana has several active substances. These include THC and related chemicals. These substances bind to molecules found in the brain. That causes the drug "high" and other effects that users feel.  Your digestive tract also has a number of molecules that bind to [facility] and  related substances. So marijuana also affects the digestive tract. For example, the drug can change the time it takes the stomach to empty. It also affects the esophageal sphincter. That's the tight band of muscle that opens and closes to let food from the esophagus into the stomach. Long-term marijuana use can change the way the affected molecules respond and lead to the symptoms of CHS.  Marijuana is the most widely used recreational drug in the [place] Young adults are the most frequent users. A small number of these people develop CHS. It often only happens in people who have regularly used marijuana. Often CHS affects those who use the drug at least once a day.  What causes cannabinoid hyperemesis syndrome?  Marijuana has very complex effects on the body. Experts are still trying to learn exactly how it causes CHS in some people.  In the brain, marijuana often has the opposite effect of CHS. It  helps prevent nausea and vomiting. The drug is also good at stopping such symptoms in people having chemotherapy.  But in the digestive tract, marijuana seems to have the opposite effect. It actually makes you more likely to have nausea and vomiting. With the first use of marijuana, the signals from the brain may be more important. That may lead to anti-nausea effects at first. But with repeated use of marijuana, certain receptors in the brain may stop responding to the drug in the same way. That may cause the repeated bouts of vomiting found in people with CHS.  It still isn't clear why some heavy marijuana users get the syndrome, but others don't.  What are the symptoms of cannabinoid hyperemesis syndrome?  People with CHS suffer from repeated bouts of vomiting. In between these episodes are times without any symptoms. Healthcare providers often divide these symptoms into 3 stages: the prodromal phase, the hyperemetic phase, and the recovery phase.  Prodromal phase. During this phase, the main symptoms  are often early morning nausea and belly (abdominal) pain. Some people also develop a fear of vomiting. Most people keep normal eating patterns during this time. Some people use more marijuana because they think it will help stop the nausea. This phase may last for months or years.  Hyperemetic phase. Symptoms during this time may include:  Ongoing nausea  Repeated episodes of vomiting  Belly pain  Decreased food intake and weight loss  Symptoms of fluid loss (dehydration)  During this phase, vomiting is often intense and overwhelming. Many people take a lot of hot showers during the day. They find that doing so eases their nausea. (That may be because of how the hot temperature affects a part of the brain called the hypothalamus. This part of the brain effects both temperature regulation and vomiting.) People often first seek medical care during this phase.  The hyperemetic phase may continue until the person completely stops using marijuana. Then the recovery phase starts.  Recovery phase. During this time, symptoms go away. Normal eating is possible again. This phase can last days or months. Symptoms often come back if the person tries marijuana again.  How is cannabinoid hyperemesis syndrome diagnosed?  Many health problems can cause repeated vomiting. To make a diagnosis, your healthcare provider will ask you about your symptoms and your past health. He or she will also do a physical exam, including an exam of your belly. Often this diagnosis can be made from history alone, however.  Your healthcare provider may also need more tests to rule out other causes of the vomiting.  CHS was only recently discovered. So some healthcare providers may not know about it. As a result, they may not spot it for many years. They often confuse CHS with cyclical vomiting disorder. That is a health problem that causes similar symptoms. A specialist trained in diseases of the digestive tract  (gastroenterologist) might make the diagnosis.  You may have CHS if you have all of these:  Long-term weekly and daily marijuana use  Belly pain  Severe, repeated nausea and vomiting  You feel better after taking a hot shower  There is no single test that confirms this diagnosis. Only improvement after quitting marijuana confirms the diagnosis.  How is cannabinoid hyperemesis syndrome treated?  To fully get better, you need to stop using marijuana all together. If you stop using marijuana, your symptoms should not come back.  Symptoms often ease after a day or 2 unless marijuana is used before  this time.  In a small sample of people with CHS, rubbing capsaicin cream on the belly helped decrease pain and nausea. The chemicals in the cream have the same effect as a hot shower.  Quitting marijuana may lead to other health benefits, such as:  Better lung function  Improved memory and thinking skills  Better sleep  Key points about cannabinoid hyperemesis syndrome  CHS is a condition that leads to repeated and severe bouts of vomiting. It results from long-term use of marijuana.  Most people self-treat using hot showers to help reduce their symptoms.  Some people with CHS may not be diagnosed for several years. Admitting to your healthcare provider that you use marijuana daily can speed up the diagnosis.  Symptoms start to go away within a day or 2 after stopping marijuana use.  Symptoms can come back if you use marijuana again  You have been scheduled for a colonoscopy. Please follow written instructions given to you at your visit today.   If you use inhalers (even only as needed), please bring them with you on the day of your procedure.  DO NOT TAKE 7 DAYS PRIOR TO TEST- Trulicity (dulaglutide) Ozempic, Wegovy (semaglutide) Mounjaro, Zepbound (tirzepatide) Bydureon Bcise (exanatide extended release)  DO NOT TAKE 1 DAY PRIOR TO YOUR TEST Rybelsus  (semaglutide) Adlyxin (lixisenatide) Victoza (liraglutide) Byetta (exanatide) ___________________________________________________________________________  _______________________________________________________  If your blood pressure at your visit was 140/90 or greater, please contact your primary care physician to follow up on this.  _______________________________________________________  If you are age 8 or older, your body mass index should be between 23-30. Your Body mass index is 27.02 kg/m. If this is out of the aforementioned range listed, please consider follow up with your Primary Care Provider.  If you are age 76 or younger, your body mass index should be between 19-25. Your Body mass index is 27.02 kg/m. If this is out of the aformentioned range listed, please consider follow up with your Primary Care Provider.   ________________________________________________________  The [facility] providers would like to encourage you to use MYCHART to communicate with providers for non-urgent requests or questions.  Due to long hold times on the telephone, sending your provider a message by [facility] may be a faster and more efficient way to get a response.  Please allow 48 business hours for a response.  Please remember that this is for non-urgent requests.  _______________________________________________________  [facility] is using a team-based approach to care.  Your team is made up of your doctor and two to three APPS. Our APPS (Nurse Practitioners and Physician Assistants) work with your physician to ensure care continuity for you. They are fully qualified to address your health concerns and develop a treatment plan. They communicate directly with your gastroenterologist to care for you. Seeing the Advanced Practice Practitioners on your physician's team can help you by facilitating care more promptly, often allowing for earlier appointments, access to diagnostic testing,  procedures, and other specialty referrals.

## 2024-06-06 ENCOUNTER — Ambulatory Visit: Payer: Self-pay | Admitting: Physician Assistant

## 2024-06-06 LAB — TISSUE TRANSGLUTAMINASE, IGA: (tTG) Ab, IgA: <1 U/mL

## 2024-06-06 LAB — IGA: Immunoglobulin A: 305 mg/dL (ref 47–310)

## 2024-06-10 ENCOUNTER — Telehealth: Payer: Self-pay | Admitting: Physician Assistant

## 2024-06-10 NOTE — Telephone Encounter (Signed)
 Called patient and informed her [facility] H. Pylori test is negative. Patient verbalized understanding.

## 2024-06-10 NOTE — Telephone Encounter (Signed)
 [facility] Negative H pylori

## 2024-06-17 ENCOUNTER — Telehealth: Payer: Self-pay | Admitting: Gastroenterology

## 2024-06-17 ENCOUNTER — Ambulatory Visit: Admitting: Gastroenterology

## 2024-06-17 ENCOUNTER — Encounter: Payer: Self-pay | Admitting: Gastroenterology

## 2024-06-17 NOTE — Telephone Encounter (Signed)
 Good Morning Dr. Charlanne,  We called this patient today at 8:48 am to see if she was coming for her procedure.   We left a message for her to call us  if she was running late or needed to reschedule.  I will NO SHOW her.

## 2024-06-17 NOTE — Telephone Encounter (Signed)
Thanks for letting me know RG 

## 2024-06-17 NOTE — Progress Notes (Signed)
NO SHOW   RG

## 2024-06-27 ENCOUNTER — Encounter: Payer: Self-pay | Admitting: Physician Assistant
